# Patient Record
Sex: Female | Born: 1935 | Race: White | Hispanic: No | Smoking: Never smoker
Health system: Southern US, Community
[De-identification: ages and names within clinical notes are randomized; demographics above are authoritative.]

## PROBLEM LIST (undated history)

## (undated) DIAGNOSIS — K635 Polyp of colon: Secondary | ICD-10-CM

## (undated) DIAGNOSIS — C801 Malignant (primary) neoplasm, unspecified: Secondary | ICD-10-CM

## (undated) DIAGNOSIS — E785 Hyperlipidemia, unspecified: Secondary | ICD-10-CM

## (undated) DIAGNOSIS — R112 Nausea with vomiting, unspecified: Secondary | ICD-10-CM

## (undated) DIAGNOSIS — F329 Major depressive disorder, single episode, unspecified: Secondary | ICD-10-CM

## (undated) DIAGNOSIS — R21 Rash and other nonspecific skin eruption: Secondary | ICD-10-CM

## (undated) DIAGNOSIS — I10 Essential (primary) hypertension: Secondary | ICD-10-CM

## (undated) DIAGNOSIS — M1990 Unspecified osteoarthritis, unspecified site: Secondary | ICD-10-CM

## (undated) DIAGNOSIS — F32A Depression, unspecified: Secondary | ICD-10-CM

## (undated) DIAGNOSIS — Z8739 Personal history of other diseases of the musculoskeletal system and connective tissue: Secondary | ICD-10-CM

## (undated) DIAGNOSIS — K219 Gastro-esophageal reflux disease without esophagitis: Secondary | ICD-10-CM

## (undated) DIAGNOSIS — Q339 Congenital malformation of lung, unspecified: Secondary | ICD-10-CM

## (undated) DIAGNOSIS — E119 Type 2 diabetes mellitus without complications: Secondary | ICD-10-CM

## (undated) DIAGNOSIS — IMO0001 Reserved for inherently not codable concepts without codable children: Secondary | ICD-10-CM

## (undated) DIAGNOSIS — N281 Cyst of kidney, acquired: Secondary | ICD-10-CM

## (undated) DIAGNOSIS — Z85828 Personal history of other malignant neoplasm of skin: Secondary | ICD-10-CM

## (undated) DIAGNOSIS — D649 Anemia, unspecified: Secondary | ICD-10-CM

## (undated) DIAGNOSIS — R351 Nocturia: Secondary | ICD-10-CM

## (undated) DIAGNOSIS — E039 Hypothyroidism, unspecified: Secondary | ICD-10-CM

## (undated) DIAGNOSIS — Z9889 Other specified postprocedural states: Secondary | ICD-10-CM

---

## 1998-11-03 ENCOUNTER — Other Ambulatory Visit: Admission: RE | Admit: 1998-11-03 | Discharge: 1998-11-03 | Payer: Self-pay | Admitting: Gynecology

## 1999-08-04 ENCOUNTER — Encounter: Admission: RE | Admit: 1999-08-04 | Discharge: 1999-08-04 | Payer: Self-pay | Admitting: Family Medicine

## 1999-08-04 ENCOUNTER — Encounter: Payer: Self-pay | Admitting: Family Medicine

## 1999-08-12 ENCOUNTER — Encounter: Payer: Self-pay | Admitting: Family Medicine

## 1999-08-12 ENCOUNTER — Encounter: Admission: RE | Admit: 1999-08-12 | Discharge: 1999-08-12 | Payer: Self-pay | Admitting: Family Medicine

## 1999-11-11 ENCOUNTER — Other Ambulatory Visit: Admission: RE | Admit: 1999-11-11 | Discharge: 1999-11-11 | Payer: Self-pay | Admitting: Gynecology

## 1999-11-19 ENCOUNTER — Encounter: Admission: RE | Admit: 1999-11-19 | Discharge: 1999-11-19 | Payer: Self-pay | Admitting: Gynecology

## 1999-11-19 ENCOUNTER — Encounter: Payer: Self-pay | Admitting: Gynecology

## 2000-08-30 ENCOUNTER — Encounter: Admission: RE | Admit: 2000-08-30 | Discharge: 2000-08-30 | Payer: Self-pay | Admitting: Family Medicine

## 2000-08-30 ENCOUNTER — Encounter: Payer: Self-pay | Admitting: Family Medicine

## 2000-11-15 ENCOUNTER — Other Ambulatory Visit: Admission: RE | Admit: 2000-11-15 | Discharge: 2000-11-15 | Payer: Self-pay | Admitting: Gynecology

## 2001-09-12 ENCOUNTER — Encounter: Admission: RE | Admit: 2001-09-12 | Discharge: 2001-09-12 | Payer: Self-pay | Admitting: Family Medicine

## 2001-09-12 ENCOUNTER — Encounter: Payer: Self-pay | Admitting: Family Medicine

## 2001-11-28 ENCOUNTER — Other Ambulatory Visit: Admission: RE | Admit: 2001-11-28 | Discharge: 2001-11-28 | Payer: Self-pay | Admitting: Gynecology

## 2002-09-25 ENCOUNTER — Encounter: Payer: Self-pay | Admitting: Family Medicine

## 2002-09-25 ENCOUNTER — Encounter: Admission: RE | Admit: 2002-09-25 | Discharge: 2002-09-25 | Payer: Self-pay | Admitting: Family Medicine

## 2002-09-27 ENCOUNTER — Encounter: Payer: Self-pay | Admitting: Family Medicine

## 2002-09-27 ENCOUNTER — Encounter: Admission: RE | Admit: 2002-09-27 | Discharge: 2002-09-27 | Payer: Self-pay | Admitting: Family Medicine

## 2002-10-11 HISTORY — PX: COLON SURGERY: SHX602

## 2003-01-15 ENCOUNTER — Ambulatory Visit (HOSPITAL_COMMUNITY): Admission: RE | Admit: 2003-01-15 | Discharge: 2003-01-15 | Payer: Self-pay | Admitting: Gastroenterology

## 2003-01-15 ENCOUNTER — Encounter (INDEPENDENT_AMBULATORY_CARE_PROVIDER_SITE_OTHER): Payer: Self-pay | Admitting: Specialist

## 2003-02-07 ENCOUNTER — Ambulatory Visit (HOSPITAL_COMMUNITY): Admission: RE | Admit: 2003-02-07 | Discharge: 2003-02-07 | Payer: Self-pay | Admitting: General Surgery

## 2003-02-07 ENCOUNTER — Encounter: Payer: Self-pay | Admitting: General Surgery

## 2003-02-21 ENCOUNTER — Inpatient Hospital Stay (HOSPITAL_COMMUNITY): Admission: RE | Admit: 2003-02-21 | Discharge: 2003-02-25 | Payer: Self-pay | Admitting: General Surgery

## 2003-02-21 ENCOUNTER — Encounter (INDEPENDENT_AMBULATORY_CARE_PROVIDER_SITE_OTHER): Payer: Self-pay | Admitting: Specialist

## 2003-03-06 ENCOUNTER — Ambulatory Visit: Admission: RE | Admit: 2003-03-06 | Discharge: 2003-03-06 | Payer: Self-pay | Admitting: General Surgery

## 2003-03-07 ENCOUNTER — Encounter: Admission: RE | Admit: 2003-03-07 | Discharge: 2003-03-07 | Payer: Self-pay | Admitting: Family Medicine

## 2003-03-07 ENCOUNTER — Encounter: Payer: Self-pay | Admitting: Family Medicine

## 2003-03-23 ENCOUNTER — Ambulatory Visit (HOSPITAL_COMMUNITY): Admission: RE | Admit: 2003-03-23 | Discharge: 2003-03-23 | Payer: Self-pay | Admitting: General Surgery

## 2003-03-23 ENCOUNTER — Encounter: Payer: Self-pay | Admitting: General Surgery

## 2003-03-31 ENCOUNTER — Inpatient Hospital Stay (HOSPITAL_COMMUNITY): Admission: EM | Admit: 2003-03-31 | Discharge: 2003-04-03 | Payer: Self-pay | Admitting: Emergency Medicine

## 2003-03-31 ENCOUNTER — Encounter: Payer: Self-pay | Admitting: Surgery

## 2003-04-01 ENCOUNTER — Encounter: Payer: Self-pay | Admitting: Surgery

## 2003-06-07 ENCOUNTER — Encounter: Payer: Self-pay | Admitting: Family Medicine

## 2003-06-07 ENCOUNTER — Encounter: Admission: RE | Admit: 2003-06-07 | Discharge: 2003-06-07 | Payer: Self-pay | Admitting: Family Medicine

## 2003-09-11 ENCOUNTER — Ambulatory Visit (HOSPITAL_COMMUNITY): Admission: RE | Admit: 2003-09-11 | Discharge: 2003-09-11 | Payer: Self-pay | Admitting: General Surgery

## 2003-09-17 ENCOUNTER — Ambulatory Visit (HOSPITAL_COMMUNITY): Admission: RE | Admit: 2003-09-17 | Discharge: 2003-09-17 | Payer: Self-pay | Admitting: Family Medicine

## 2003-10-12 HISTORY — PX: HERNIA REPAIR: SHX51

## 2003-11-08 ENCOUNTER — Observation Stay (HOSPITAL_COMMUNITY): Admission: RE | Admit: 2003-11-08 | Discharge: 2003-11-09 | Payer: Self-pay | Admitting: General Surgery

## 2003-12-24 ENCOUNTER — Encounter: Admission: RE | Admit: 2003-12-24 | Discharge: 2003-12-24 | Payer: Self-pay | Admitting: Thoracic Surgery

## 2004-02-05 ENCOUNTER — Ambulatory Visit (HOSPITAL_COMMUNITY): Admission: RE | Admit: 2004-02-05 | Discharge: 2004-02-05 | Payer: Self-pay | Admitting: Gastroenterology

## 2004-02-05 ENCOUNTER — Encounter (INDEPENDENT_AMBULATORY_CARE_PROVIDER_SITE_OTHER): Payer: Self-pay | Admitting: Specialist

## 2004-04-29 ENCOUNTER — Encounter: Admission: RE | Admit: 2004-04-29 | Discharge: 2004-04-29 | Payer: Self-pay | Admitting: Thoracic Surgery

## 2004-06-19 ENCOUNTER — Encounter: Admission: RE | Admit: 2004-06-19 | Discharge: 2004-06-19 | Payer: Self-pay | Admitting: Family Medicine

## 2004-09-02 ENCOUNTER — Encounter: Admission: RE | Admit: 2004-09-02 | Discharge: 2004-09-02 | Payer: Self-pay | Admitting: Thoracic Surgery

## 2004-10-11 HISTORY — PX: THYROIDECTOMY: SHX17

## 2005-03-02 ENCOUNTER — Encounter: Admission: RE | Admit: 2005-03-02 | Discharge: 2005-03-02 | Payer: Self-pay | Admitting: Thoracic Surgery

## 2005-03-31 ENCOUNTER — Encounter: Admission: RE | Admit: 2005-03-31 | Discharge: 2005-03-31 | Payer: Self-pay | Admitting: Endocrinology

## 2005-04-28 ENCOUNTER — Encounter (INDEPENDENT_AMBULATORY_CARE_PROVIDER_SITE_OTHER): Payer: Self-pay | Admitting: Specialist

## 2005-04-28 ENCOUNTER — Encounter: Admission: RE | Admit: 2005-04-28 | Discharge: 2005-04-28 | Payer: Self-pay | Admitting: Endocrinology

## 2005-04-28 ENCOUNTER — Other Ambulatory Visit: Admission: RE | Admit: 2005-04-28 | Discharge: 2005-04-28 | Payer: Self-pay | Admitting: Interventional Radiology

## 2005-04-29 ENCOUNTER — Other Ambulatory Visit: Admission: RE | Admit: 2005-04-29 | Discharge: 2005-04-29 | Payer: Self-pay | Admitting: Gynecology

## 2005-07-14 ENCOUNTER — Ambulatory Visit (HOSPITAL_COMMUNITY): Admission: RE | Admit: 2005-07-14 | Discharge: 2005-07-15 | Payer: Self-pay | Admitting: General Surgery

## 2005-07-14 ENCOUNTER — Encounter (INDEPENDENT_AMBULATORY_CARE_PROVIDER_SITE_OTHER): Payer: Self-pay | Admitting: *Deleted

## 2005-08-27 ENCOUNTER — Encounter: Admission: RE | Admit: 2005-08-27 | Discharge: 2005-08-27 | Payer: Self-pay | Admitting: Family Medicine

## 2005-09-01 ENCOUNTER — Encounter: Admission: RE | Admit: 2005-09-01 | Discharge: 2005-09-01 | Payer: Self-pay | Admitting: Thoracic Surgery

## 2006-03-31 ENCOUNTER — Encounter: Admission: RE | Admit: 2006-03-31 | Discharge: 2006-03-31 | Payer: Self-pay | Admitting: Family Medicine

## 2006-09-06 ENCOUNTER — Encounter: Admission: RE | Admit: 2006-09-06 | Discharge: 2006-09-06 | Payer: Self-pay | Admitting: Family Medicine

## 2007-08-02 ENCOUNTER — Other Ambulatory Visit: Admission: RE | Admit: 2007-08-02 | Discharge: 2007-08-02 | Payer: Self-pay | Admitting: Gynecology

## 2007-10-17 ENCOUNTER — Encounter: Admission: RE | Admit: 2007-10-17 | Discharge: 2007-10-17 | Payer: Self-pay | Admitting: Family Medicine

## 2008-10-16 ENCOUNTER — Encounter: Admission: RE | Admit: 2008-10-16 | Discharge: 2008-10-16 | Payer: Self-pay | Admitting: Family Medicine

## 2008-10-30 ENCOUNTER — Encounter: Admission: RE | Admit: 2008-10-30 | Discharge: 2008-10-30 | Payer: Self-pay | Admitting: Family Medicine

## 2009-11-03 ENCOUNTER — Encounter: Admission: RE | Admit: 2009-11-03 | Discharge: 2009-11-03 | Payer: Self-pay | Admitting: Family Medicine

## 2010-04-02 ENCOUNTER — Encounter: Admission: RE | Admit: 2010-04-02 | Discharge: 2010-04-02 | Payer: Self-pay | Admitting: Family Medicine

## 2010-11-04 ENCOUNTER — Encounter
Admission: RE | Admit: 2010-11-04 | Discharge: 2010-11-04 | Payer: Self-pay | Source: Home / Self Care | Attending: Family Medicine | Admitting: Family Medicine

## 2010-11-09 ENCOUNTER — Other Ambulatory Visit: Payer: Self-pay | Admitting: Family Medicine

## 2010-11-09 DIAGNOSIS — R928 Other abnormal and inconclusive findings on diagnostic imaging of breast: Secondary | ICD-10-CM

## 2010-11-10 ENCOUNTER — Encounter
Admission: RE | Admit: 2010-11-10 | Discharge: 2010-11-10 | Payer: Self-pay | Source: Home / Self Care | Attending: Family Medicine | Admitting: Family Medicine

## 2011-02-26 NOTE — Discharge Summary (Signed)
NAMEFRANCHESKA, Lauren Rose NO.:  [id number]   MEDICAL RECORD NO.:  [id number]                   PATIENT TYPE:  INP   LOCATION:  0454                                 FACILITY:  [facility]   PHYSICIAN:  Angelia Mould. Derrell Lolling, M.D.             DATE OF BIRTH:  1922/08/09   DATE OF ADMISSION:  03/31/2003  DATE OF DISCHARGE:  04/03/2003                                 DISCHARGE SUMMARY   FINAL DIAGNOSES:  1. Abdominal wall abscess.  2. Hypertension.  3. Villous adenoma of the cecum, status post right colectomy.   OPERATIONS PERFORMED:  Wound exploration, drainage of abscess, April 01, 2003.   HISTORY:  This is a 77 year old white female who has a significant recent  history of a right colectomy by me in May 2004 for a benign polyp of the  cecum.  She did well postoperatively but over the past few weeks developed  abdominal pain and swelling in the incision.  She underwent CT scan of the  abdomen apparently one week prior to admission which showed some fluid in  the abdominal wall.  A needle aspiration was performed, and the patient was  placed on oral antibiotics.  The patient developed progressive nausea and  pain.  She came to the emergency room and saw Dr. Gerrit Friends on the evening of  March 31, 2003.  She was admitted for further evaluation and management.   EXAM ON ADMISSION:  VITAL SIGNS:  She was noted to have a temperature of  97.7, pulse 105, respirations 22, blood pressure 157/83.  GENERAL:  She was in mild distress.  LUNGS:  Clear.  HEART:  Regular rate and rhythm.  ABDOMEN:  Soft.  There was a healed transverse wound in the abdomen just to  the right of the umbilicus with some cellulitis and a palpable mass  affecting this area.  Not truly fluctuant.   ADMISSION DATA:  White blood cell count 18,900, hemoglobin 10.6.   HOSPITAL COURSE:  The patient was admitted by Dr. Gerrit Friends and placed on  intravenous antibiotics.  A CT scan showed an abdominal wall  abscess but no  evidence for intraperitoneal disease.  Dr. Gerrit Friends notified me of her  admission, and I saw her in the early morning of April 01, 2003, and advised  her that she would need to be taken to the operating room for exploration  and drainage of this abscess.  She agreed with this plan.  The patient was  taken to the operating room at noon on April 01, 2003, and I drained what was  an obvious abdominal wall abscess.  Fascial layers appeared to be intact.  There was no evidence of enteric content, and the wound was irrigated and  packed.   Final culture grew Fusobacterium species and Fusarium species.   The patient did very well following drainage.  The wound very rapidly  cleaned  up and began to develop healthy granulation tissue, and there was no  significant odor or further drainage.  She was discharged on April 03, 2003,  feeling well, tolerating her diet, ambulatory, and was noting much  improvement in her general feeling of well being.  She was given a  prescription for Cipro for three more days.  Home health nursing was  arranged for b.i.d. dressing changes.  She is to return to see me in the  office in 10-12 days.                                               Angelia Mould. Derrell Lolling, M.D.    HMI/MEDQ  D:  04/18/2003  T:  04/18/2003  Job:  [id number]

## 2011-02-26 NOTE — Op Note (Signed)
Lauren Rose, Lauren Rose                            ACCOUNT NO.:  [id number]   MEDICAL RECORD NO.:  [id number]                   PATIENT TYPE:  INP   LOCATION:  0358                                 FACILITY:  [facility]   PHYSICIAN:  Angelia Mould. Derrell Lolling, M.D.             DATE OF BIRTH:  [birth year]   DATE OF PROCEDURE:  02/21/2003  DATE OF DISCHARGE:                                 OPERATIVE REPORT   PREOPERATIVE DIAGNOSIS:  Villous adenoma of the cecum.   POSTOPERATIVE DIAGNOSIS:  Villous adenoma of the cecum.   OPERATION PERFORMED:  Laparoscopic-assisted right colectomy.   SURGEON:  Angelia Mould. Derrell Lolling, M.D.   FIRST ASSISTANT:  Adolph Pollack, M.D.   OPERATIVE INDICATIONS:  This is a 77 year old white female in reasonably  good health.  She has had some vague right lower quadrant abdominal pain.  She had a screening colonoscopy recently which showed a 6 cm polypoid mass  in the cecum and another small polyp nearby.  There were small polyps in the  left colon which where biopsied and were totally benign.  The biopsy of the  cecal mass showed villous adenoma with high-grade dysplasia focally.  She  has been counseled as an outpatient regarding surgical management of this  problem.  She has undergone a bowel prep at home.  She is admitted  electively for laparoscopic right colectomy.   OPERATIVE FINDINGS:  There was a soft, palpable mass in the cecum near the  ileocecal valve.  The serosa of the colon looked perfectly normal.  There  were no palpably or visually enlarged lymph nodes in the colon mesentery or  small bowel mesentery.  The gallbladder, stomach, and duodenum looked  normal.  The superior and inferior surfaces of the right and left lobes of  the liver looked normal.  The spleen was not enlarged.  The right ureter was  easily visualized and preserved.  There was no retroperitoneal mass.   OPERATIVE TECHNIQUE:  Following the induction of general endotracheal  anesthesia, the  patient's abdomen was prepped and draped in a sterile  fashion.  A Foley catheter had been previously placed.  Transverse incision  was made at the superior rim of the umbilicus.  The fascia was incised  transversely.  The abdomen was entered under direct vision.  A 10 mm Hasson  trocar was inserted and secured with a pursestring suture of 0 Vicryl.  Pneumoperitoneum was created.  Video camera was inserted with visualization  and findings as described above.  A 10 mm trocar was placed in the  suprapubic area and a 10 mm trocar placed in the left mid abdomen at the  level of the umbilicus, lateral to the rectus sheath.   The abdomen was thoroughly explored with the patient in a variety of  positions to visualize all of the spaces of the abdomen, and I found no  other abnormality.  The patient was placed in steep Trendelenburg position and rotated to the  left.  We identified the cecum and the appendix and elevated these up.  Using the harmonic scalpel, we divided the lateral peritoneal attachments  and mobilized the right colon all the way from the cecum up to the hepatic  flexure and with blunt dissection, we were able to mobilize this all the way  to the midline.  We identified the C-loop of the duodenum.  We identified  the right ureter.  We further dissected the peritoneum in the right lower  quadrant which mobilized the terminal ileum and took the tension off of the  terminal ileum.  Once all of this was done, the cecum was completely mobile  and could be displaced all the way to the spleen.   The patient was then placed in reverse Trendelenburg position, and we took  down the hepatic flexure by dividing all of the lateral peritoneal  attachments and the tissue layers.  We were very careful to keep the  duodenum and the gallbladder in sight throughout this.  We mobilized the  transverse colon all the way over to the level of the middle colic vessels.  We inspected for bleeding and  found none.  We checked the mobility of the  colon and decided that we would extract this through a transverse incision  just above the umbilicus.   We made a 6 cm transverse incision just above the level of the umbilicus to  incorporate the supraumbilical trocar site.  Anterior rectus sheath, rectus  muscle, and posterior rectus sheaths were incised with electrocautery under  direct vision.  The abdominal cavity was entered.  We inserted a wound  protector.  We delivered the terminal ileum and right colon and hepatic  flexure through this wound, and we had excellent mobility.  We could palpate  the 4-5 cm soft mass at the ileocecal valve.  We found no adenopathy.   We cleaned off the transverse colon and identified the middle colic vessels.  We transected the transverse colon just to the right of the middle colic  vessels.  We transected the terminal ileum about five inches proximal to  ileocecal valve.  We scored the mesentery all the way back as far as  possible.  We isolated the ileocolic vessels and right colic vessels between  clamps and divided these.  They were suture ligated with 2-0 silk suture  ligature and then doubly tied with 2-0 silk ties.  The specimen was removed  and sent to the lab.  Dr. Jimmy Picket examined the specimen and noted that  we had excellent margins and felt that this was consistent with a villous  adenoma but because of its size, could not rule out carcinoma.  Final  pathology is pending.   Anastomosis was created between the terminal ileum and the mid transverse  colon using a GIA stapling device.  The defect left in the bowel was closed  with a TA 60 stapling device.  A few extra sutures of 3-0 silk were placed  to reinforce the staple lines in critical areas.   At this point, we changed our instruments and gloves and suction devices. We irrigated the abdomen through the open wound somewhat.  There did not  appear to be any bleeding.  We checked the  anastomosis, and it looked fine.  We closed the mesentery with interrupted figure-of-eight sutures of 3-0  silk.  We then returned the anastomosis and the intestinal  tract back to the  abdominal cavity.  The posterior rectus sheath was closed with a running  suture of #1 PDS.  The wound was irrigated with saline, and the anterior  rectus sheath was closed with a running suture of #1 PDS.  The wound was  irrigated with saline, and the skin was closed with skin staples.   We then reinsufflated the abdomen and reinserted the video camera and the  suction device.  We suctioned out what little fluid was left.  We looked  above the anastomosis and in the right gutter and down in the pelvis and  sucked out a little bit of irrigation fluid, but there did not appear to be  any bleeding whatsoever.  The anastomosis looked pink and healthy.   The trocars were removed under direct vision.  There was no bleeding from  the trocar sites.  The pneumoperitoneum was released.  The trocar sites were  closed with skin staples.  Clean bandages were placed and the patient taken  to the recovery room in stable condition.  Estimated blood loss was about 30  mL.  Complications none.  Sponge, needle, and instrument counts were  correct.                                               Angelia Mould. Derrell Lolling, M.D.    HMI/MEDQ  D:  02/21/2003  T:  02/21/2003  Job:  [id number]   cc:   Mosetta Putt, M.D.  [address] [place]  [place] [address]  Fax: [phone]   Llana Aliment. Malon Kindle., M.D.  [address], [address]  [place]  [place] [address]  Fax: [phone]

## 2011-02-26 NOTE — Op Note (Signed)
Lauren Rose, Lauren Rose NO.:  [id number]   MEDICAL RECORD NO.:  [id number]                   PATIENT TYPE:  INP   LOCATION:  0454                                 FACILITY:  [facility]   PHYSICIAN:  Angelia Mould. Derrell Lolling, M.D.             DATE OF BIRTH:  1922/04/09   DATE OF PROCEDURE:  04/01/2003  DATE OF DISCHARGE:                                 OPERATIVE REPORT   PREOPERATIVE DIAGNOSIS:  Abdominal wall abscess.   POSTOPERATIVE DIAGNOSIS:  Abdominal wall abscess.   OPERATION:  Incision and drainage of abdominal wall abscess, debridement of  subcutaneous tissue.   SURGEON:  Angelia Mould. Derrell Lolling, M.D.   INDICATIONS FOR PROCEDURE:  This is a 77 year old white female who underwent  a laparoscopic assisted right colectomy approximately 5-6 weeks ago for a  villous adenoma of the cecum. She was recovering uneventfully from that  surgery. She presents now with a one week history of progressing swelling,  pain and erythema in the right side of the abdomen underlying the palpable  incision. CT scan performed late last night shows a large abdominal wall  abscess but there was no evidence of any intraperitoneal process.  Specifically there was no evidence of bowel obstruction, free fluid, air or  inflammatory process in the abdomen. It is felt that she has an abdominal  wall abscess and is brought to the operating room for drainage.   DESCRIPTION OF PROCEDURE:  Following the induction of general endotracheal  anesthesia, the patient's abdomen was prepped and draped in a sterile  fashion. The right transverse incision was incised and opened and I drained  a large foul smelling tan creamy purulent fluid from the abdominal wall.  This was sent for aerobic and anaerobic cultures as well as a stat Gram's  stain. This was irrigated out and a little bit of soft tissue was debrided.  The abdominal wall itself appeared intact. This did not appear to extend  into the  peritoneal cavity. There was no feces or enteric drainage  whatsoever. The wound was thoroughly washed out and irrigated several times  and then packed with a saline moistened Kerlix. Hemostasis was excellent and  achieved with electrocautery. The wound was covered with ABD pads and taped  in place.   She tolerated the procedure well and was taken to the recovery room in  stable condition. Estimated blood loss was about 20 mL. Complications none.  Sponge, needle and instrument counts were correct.                                               Angelia Mould. Derrell Lolling, M.D.    HMI/MEDQ  D:  04/01/2003  T:  04/02/2003  Job:  [id number]   cc:   Mosetta Putt, M.D.  [address] [place]  [place] [address]  Fax: [phone]

## 2011-02-26 NOTE — H&P (Signed)
NAME:  Lauren Rose, Lauren Rose NO.:  [id number]   MEDICAL RECORD NO.:  [id number]                   PATIENT TYPE:  EMS   LOCATION:  ED                                   FACILITY:  [facility]   PHYSICIAN:  Velora Heckler, M.D.                DATE OF BIRTH:  October 21, 1921   DATE OF ADMISSION:  03/31/2003  DATE OF DISCHARGE:                                HISTORY & PHYSICAL   CHIEF COMPLAINT:  Abdominal pain, nausea, right lower quadrant redness.   HISTORY OF PRESENT ILLNESS:  The patient is a 77 year old white female who  presents this morning to the emergency department at [facility]  with abdominal pain, erythema at the right lower quadrant wound, and nausea.  The patient underwent partial colectomy by Dr. Claud Kelp in May 2004,  for polyp of the cecum.  The patient did well postoperatively.  However,  over the past few weeks she has noted development of abdominal pain.  She  underwent CT scan of the abdomen eight days ago at [facility].  Dr. Oley Balm performed fine needle aspiration of fluid.  Cultures were slow to  return, but the patient was started on oral antibiotics on Friday, March 29, 2003.  She began taking oral Cipro and Flagyl.  Over the past several days,  the patient has noted nausea, increased abdominal pain.  The patient does  note that she is having normal bowel movements without pain and no evidence  of bleeding.  She has had no significant fever.  However, she has noted  swelling of the right lower quadrant and redness on the abdominal wall.  She  presents to the emergency department for evaluation.   PAST MEDICAL HISTORY:  1. Status post partial colectomy in May 2004, by Dr. Claud Kelp.  2. History of hypertension.  3. Status post thyroidectomy in 1970's by Dr. Langston Masker.   MEDICATIONS:  1. Cozaar 50 mg daily.  2. Norvasc 5 mg daily.  3. Currently on Cipro and Flagyl p.o.   ALLERGIES:  PENICILLIN causing diarrhea.   SOCIAL HISTORY:  The patient is accompanied by her daughter.  She lives at  home here in [place] with her husband.  She does not smoke.  She does not  drink alcohol.   FAMILY HISTORY:  Noncontributory.   REVIEW OF SYSTEMS:  A 15 system review without significant other positives  except as noted above.   PHYSICAL EXAMINATION:  GENERAL:  A 77 year old well-developed, well-  nourished white female on a stretcher in the emergency department.  VITAL SIGNS:  Temperature 97.7, pulse 105, respirations 22, blood pressure  157/83.  HEENT:  Normocephalic.  Sclerae are clear.  Dentition is fair.  Voice  quality is normal.  NECK:  A well-healed surgical wound.  There is fullness in the right thyroid  bed without discrete nodule.  There is  no tenderness.  There is no anterior  or posterior cervical adenopathy.  LUNGS:  Clear to auscultation bilaterally.  There are no rales or rhonchi.  There is no costovertebral angle tenderness.  CARDIAC:  Regular rate and rhythm without murmur.  Peripheral pulses are  full.  ABDOMEN:  Soft, mildly distended.  There is a healed surgical wound in the  left lower quadrant measuring approximately 1.5 cm.  There is a surgical  wound just to the right of the umbilicus in the right lower quadrant  measuring approximately 6 cm in length.  It is healed.  There is no  drainage.  However, there is considerable induration and erythema on the  abdominal wall.  On palpation, there is a mass effect which measures  approximately 15 cm in dimension.  It is not truly fluctuant, but there is  concern over subcutaneous abscess versus a deeper intra-abdominal process.  There is tenderness and voluntary guarding.  There are bowel sounds present.  There is no sign of hernia.  EXTREMITIES:  Nontender without edema.  NEUROLOGIC:  The patient is alert and oriented without focal deficit.   LABORATORY DATA:  White blood cell count 18.9, hemoglobin 10.6, platelet  count 698,000.   Complete metabolic profile and urinalysis are pending.   RADIOGRAPHIC STUDIES:  Abdominal x-rays today show air fluid levels in the  right lower quadrant with questionable bowel wall edema.  Chest x-ray shows  no active disease.   DIAGNOSES:  1. Abdominal wall cellulitis, rule out subcutaneous versus intra-abdominal     abscess, rule out colonic fistula.  2. Hypertension.  3. Dehydration.   PLAN:  1. Admission to [facility].  2. Conversion to intravenous antibiotics.  3. CT scan of the abdomen and pelvis with potential percutaneous drainage.  4. Potential operative intervention with drainage of subcutaneous or intra-     abdominal abscess.                                               Velora Heckler, M.D.    TMG/MEDQ  D:  03/31/2003  T:  03/31/2003  Job:  [id number]   cc:   Angelia Mould. Derrell Lolling, M.D.  [address]  [place]  [place] [address]  Fax: [phone]   Mosetta Putt, M.D.  [address] [place]  [place] [address]  Fax: [phone]   Llana Aliment. Malon Kindle., M.D.  [address]  [place]  [place] [address]  Fax: [phone]

## 2011-02-26 NOTE — Op Note (Signed)
NAMERAEONNA, MILO NO.:  [id number]   MEDICAL RECORD NO.:  [id number]          PATIENT TYPE:  OIB   LOCATION:  0098                         FACILITY:  [facility]   PHYSICIAN:  Angelia Mould. Derrell Lolling, M.D.DATE OF BIRTH:  [birth year]   DATE OF PROCEDURE:  07/14/2005  DATE OF DISCHARGE:                                 OPERATIVE REPORT   PREOPERATIVE DIAGNOSIS:  Bilateral thyroid nodules.   POSTOPERATIVE DIAGNOSIS:  Bilateral thyroid nodules.   OPERATION PERFORMED:  Total thyroidectomy.   SURGEON:  Angelia Mould. Derrell Lolling, M.D.   FIRST ASSISTANT:  Maisie Fus A. Cornett, M.D.   OPERATIVE INDICATIONS:  This is an 77 year old white female who underwent  some type of thyroid surgery many years ago by Dr. Langston Masker. I think she  simply had an adenoma excised from the central portion of the neck. She has  been known to have an autonomously functioning right thyroid nodule which is  hot on a scan but clinically and biochemically she has been euthyroid. She  states the nodule on the right side of her neck has been enlarging. On the  left side, she has a small nodule which has also increased in size to 2 cm.  Fine needle aspiration cytology of the right side shows a benign  nonneoplastic goiter. Fine needle aspiration cytology of the left side shows  a follicular lesion which is somewhat worrisome and indeterminate. She is  anxious and would like everything removed. I have evaluated her as an  outpatient and we have planned a total thyroidectomy.   FINDINGS:  The patient had a soft, smooth mass in the right thyroid lobe  about 2-1/2 to 3 cm in size. There may have been another small, hard nodule  medially as well. On the left side, there was small, firm nodule in the mid  portion of the thyroid. I did not feel any adenopathy. The isthmus of the  thyroid was surgically absent. On the left side, I identified the superior  and inferior parathyroid glands and they were preserved. On the right  side,  I identified the inferior parathyroid gland but I did not see a superior  parathyroid gland on the right.   OPERATIVE TECHNIQUE:  Following the induction of general endotracheal  anesthesia, the patient was placed in a reverse Trendelenburg position with  the neck extended. The neck was prepped and draped in a sterile fashion. A  curved incision was made transversely in the neck about 2 cm above the  suprasternal notch. Dissection was carried down through subcutaneous tissue  and through the platysma. Platysma flaps were raised superiorly and  inferiorly with electrocautery and a self-retaining retractor was placed.  The strap muscles were divided in the midline. We found that the trachea was  immediately encountered and there was no isthmus to the thyroid. We did  dissect the strap muscles off the right and left lobes of the thyroid and  mobilized these. I first dissected the left side, dissecting the muscles off  of the thyroid all the way. I isolated the superior thyroid vessels and  ligated  them in continuity with 2-0 silk ties and I placed a metal clip  above the upper, tied for security and then divided the upper pole vessels.  The lower pole was mobilized mostly with blunt dissection. There was a few  venous channels that were divided between metal clips. We then dissected the  rest of the left lobe from lateral to medial controlling the middle thyroid  vein and the inferior thyroid artery with metal clips and dividing them. We  identified the recurrent laryngeal nerve on the left and it was preserved.  We dissected the thyroid gland up off the trachea and sent it as a separate  specimen labeled as left thyroid lobe.   We then did mobilize the right thyroid lobe dissecting the strap muscles off  of the thyroid gland. We isolated the upper pole vessels, ligated them in  continuity with 2-0 silk ligatures and placed a metal clip above the upper  ligature and then divided the  upper pole vessels. The lower pole was  mobilized mostly by blunt dissection since the cystic smooth process was  mostly present in the lower pole. We dissected and stayed right in the  capsule of the thyroid gland. The middle thyroid vein was isolated,  controlled with metal clips and divided. The inferior thyroid artery was  isolated, controlled with metal clips and divided. Keeping the dissection in  the thyroid gland kept as well away from the region of the recurrent  laryngeal nerve but we did not visualize the recurrent laryngeal nerve on  the right side. Nevertheless, the thyroid gland came easily up onto the  trachea and was removed and sent as a separate specimen. The wounds were  irrigated with saline. Hemostasis was excellent. We placed some Surgicel  gauze in the bed of the thyroid on each side and observed this for 3 or 4  minutes and there was no bleeding. The strap muscles were closed in the  midline with interrupted sutures of 3-0 Vicryl. The platysma muscle was  closed with interrupted sutures of 3-0 Vicryl, the skin closed with skin  staples and Steri-Strips. Clean bandages were placed and the patient taken  to the recovery room in stable condition. Estimated blood loss was about 20  of 25 mL. Complications none. Sponge, needle and instrument counts were  correct.      Angelia Mould. Derrell Lolling, M.D.  Electronically Signed     HMI/MEDQ  D:  07/14/2005  T:  07/14/2005  Job:  [id number]   cc:   Dorisann Frames, M.D.  Fax: [phone]   Mosetta Putt, M.D.  Fax: [phone]   Llana Aliment. Malon Kindle., M.D.  Fax: [phone]

## 2011-02-26 NOTE — Op Note (Signed)
NAME:  Lauren Rose, Lauren Rose                            ACCOUNT NO.:  [id number]   MEDICAL RECORD NO.:  [id number]                   PATIENT TYPE:  AMB   LOCATION:  ENDO                                 FACILITY:  [facility]   PHYSICIAN:  James L. Malon Kindle., M.D.          DATE OF BIRTH:  [birth year]   DATE OF PROCEDURE:  02/05/2004  DATE OF DISCHARGE:                                 OPERATIVE REPORT   PROCEDURE:  Colonoscopy and polypectomy.   MEDICATIONS:  Fentanyl 75 mcg, Versed 6 mg IV.   INDICATIONS:  Patient has had multiple adenomatous colon polyps removed  slightly over a year ago.  Had a massive polyp with high-grade glandular  dysplasia in the cecum requiring a right hemicolectomy.  This, fortunately,  was not malignant but did have high-grade glandular dysplasia.  This is done  as a follow-up.   DESCRIPTION OF PROCEDURE:  The procedure had been explained to the patient  and consent obtained.  With the patient in the left lateral decubitus  position, the Olympus scope was inserted and advanced.  Prep was quite good.  We were able to reach the anastomosis using some abdominal pressure.  It was  clearly seen.  The scope was withdrawn and the transverse colon, splenic  flexure were seen well.  At 80 cm from the anal verge in the descending  colon, two polyps were seen.  One was 3 mm, I removed with the snare and  sucked though the scope.  The other was a 1 cm sessile polyp that was  removed in two pieces and sucked through the scope.  No other polyps were  seen in the descending, sigmoid colon, or rectum.  The scope was withdrawn.  The patient tolerated the procedure well.   ASSESSMENT:  Colon polyps removed from the descending colon.   PLAN:  Routine postpolypectomy instructions.  Will check pathology and  recommend repeating in three years.                                               James L. Malon Kindle., M.D.    Waldron Session  D:  02/05/2004  T:  02/05/2004  Job:  [id number]   cc:    Mosetta Putt, M.D.  [address] [place]  [place] [address]  Fax: [phone]   Angelia Mould. Derrell Lolling, M.D.  [address]  [place]  [place] [address]  Fax: [phone]

## 2011-02-26 NOTE — Op Note (Signed)
NAME:  Lauren Rose, Lauren Rose                            ACCOUNT NO.:  [id number]   MEDICAL RECORD NO.:  [id number]                   PATIENT TYPE:  AMB   LOCATION:  DAY                                  FACILITY:  [facility]   PHYSICIAN:  Angelia Mould. Derrell Lolling, M.D.             DATE OF BIRTH:  1922/10/04   DATE OF PROCEDURE:  11/08/2003  DATE OF DISCHARGE:                                 OPERATIVE REPORT   PREOPERATIVE DIAGNOSIS:  Ventral incisional hernia.   POSTOPERATIVE DIAGNOSIS:  Ventral incisional hernia.   OPERATION:  Laparoscopic repair of ventral incisional hernia with mesh (15  cm x 18 cm Parietex composite mesh).   SURGEON:  Angelia Mould. Derrell Lolling, M.D.   INDICATIONS FOR PROCEDURE:  This is a 77 year old white female who underwent  a laparoscopic assisted right colectomy for villous adenoma of the cecum  complicated by a postoperative wound abscess necessitating open drainage and  healing by secondary intention.  She recovered uneventfully but subsequently  developed a ventral hernia which is approximately 5-6 cm in diameter and has  been enlarging and has been bothering the patient. She is stable medically  and is brought to the operating room electively for repair of her hernia.   TECHNIQUE:  Following the induction of general endotracheal anesthesia, a  Foley catheter was inserted. The patient's abdomen was prepped and draped in  a sterile fashion.  A transverse incision was made in the left mid abdomen.  Using an Optiview trocar and the zero degree 10 mm scope, we passed the  Optiview port through the abdominal wall layers without much trouble and  entered easily a free space into the left upper quadrant.  Pneumoperitoneum  was created at 15 mmHg.  The video camera was inserted and we had good  visualization.  There was a large ventral hernia with a great deal of  omentum adherent to this. We ultimately placed 5 mm trocars in the  epigastrium, left lower quadrant and right mid abdomen to  allow [place] to view  the hernia and the repair from all angles.  Using the harmonic scalpel and  counter traction, we slowly debrided all of the omentum out of the hernia  sac.  That went well. There was never any small bowel or colon in the way.  Once this was all cleared out, we checked for bleeding and there was none.  We then marked the hernia site by passing a spinal needle full thickness  through the abdominal wall.  We then marked the abdominal wall so that we  could measure how big the mesh needed to be and we made the mesh 3 or 4 cm  wider at all places.  This ultimately turned out to be about 18 cm  transversely by about 15 cm vertically.  We brought a piece of Parietex  composite mesh to the operative field and we marked the template of the  repair on the abdominal wall marking eight equidistant points for suture  fixation.  The mesh was cut to match this and then marked appropriately.  We  used #0 Novofil sutures and passed them through the mesh at the eight  equidistant points being careful to place this on the anterior, rough edge  making sure that the smooth edge was posterior.  After all of these sutures  were placed, the mesh was moistened with saline, rolled up and then inserted  into the abdominal cavity. The mesh was then opened up and positioned.  We  brought the suture fixation through the abdominal wall at the eight  equidistant points through tiny puncture wounds and using the endoclose  device. After all of the sutures were passed through the abdominal wall,  they were pulled up and the mesh covered the defect quite nicely and there  was really no redundancy.  All the sutures were tied.  We then used the 5 mm  tacker and probably used 50-55 tacks to tack around the entire circumference  of the mesh making sure that all the tacks were no more than 1 cm apart. We  also placed a few of the 5 mm tacks centrally to fix the mesh to the  abdominal wall.  We inspected the mesh  fixation from multiple angles on  three occasions and found good fixation and we were very satisfied with  this. Trocars were removed under direct vision, there was no bleeding from  the trocar sites. The  pneumoperitoneum was released.  The skin incisions were closed with  subcuticular sutures of 4-0 Vicryl and Steri-Strips.  Clean bandages were  placed and the patient taken to the recovery room in stable condition.  Estimated blood loss was about 15-20 mL.  Complications none. Sponge, needle  and instrument counts were correct.                                               Angelia Mould. Derrell Lolling, M.D.    HMI/MEDQ  D:  11/08/2003  T:  11/08/2003  Job:  [id number]   cc:   Mosetta Putt, M.D.  [address] [place]  [place] [address]  Fax: [phone]

## 2011-02-26 NOTE — Op Note (Signed)
NAME:  Lauren Rose, Lauren Rose                            ACCOUNT NO.:  [id number]   MEDICAL RECORD NO.:  [id number]                   PATIENT TYPE:  AMB   LOCATION:  ENDO                                 FACILITY:  [facility]   PHYSICIAN:  James L. Malon Kindle., M.D.          DATE OF BIRTH:  [birth year]   DATE OF PROCEDURE:  01/15/2003  DATE OF DISCHARGE:                                 OPERATIVE REPORT   PROCEDURE:  Colonoscopy with biopsy and polypectomy.   MEDICATIONS:  Fentanyl 75 mcg, Versed 7 mg IV.   INDICATIONS FOR PROCEDURE:  The patient does have a family history of colon  polyps, never had a screening colonoscopy.   DESCRIPTION OF PROCEDURE:  The procedure had been explained to the patient  and consent obtained. With the patient in the left lateral decubitus  position, the Olympus pediatric scope was inserted and advanced. In the  sigmoid colon, we saw several polyps upon entry. Using abdominal pressure  and position change, we were able to advance over to the cecum, the right  lower quadrant transilluminated. The cecum was filled by a large polypoid  mass with surface ulcerations, it was soft and mobile. The size was  estimated to be at least 6 cm when we opened, biopsy forceps were placed in  the lumen, it may have been larger. After evaluating this, I felt this was  not really amenable to endoscopic removal and took multiple biopsies. Very  close to this, 2 or 3 cm away was a 1 cm sessile polyp that I elected to  leave at this point since it was clear she does need a surgical procedure.  The remainder of the ascending colon and transverse colon were normal. At 80  cm from the anal verge, a 3 cm sessile polyp was encountered and was removed  in three pieces. I placed them in jar #2. At 45 cm from the anal verge, a 1  cm sessile polyp was removed and placed in jar #3. At 30 cm from the anal  verge, a 1 to 1 1/2 cm was removed and placed in jar #4. No other polyps  were seen. The scope  was withdrawn.  The patient tolerated the procedure  well.   ASSESSMENT:  1. Large polypoid cecal mass high risk of cancer in this and it is not     really very amenable to endoscopic removal.  2. Descending colon and sigmoid colon polyps removed.   PLAN:  Will check path. Will likely need a right hemicolectomy. After the  path report is returned will discuss this with her in further detail. Will  definitely need frequent followup colonoscopies. Will plan on doing her in  one year for repeat.  James L. Malon Kindle., M.D.   Waldron Session  D:  01/15/2003  T:  01/15/2003  Job:  [id number]   cc:   Mosetta Putt, M.D.  [address] [place]  [place] [address]  Fax: [phone]

## 2011-02-26 NOTE — Discharge Summary (Signed)
Lauren Rose, Lauren Rose                            ACCOUNT NO.:  [id number]   MEDICAL RECORD NO.:  [id number]                   PATIENT TYPE:  INP   LOCATION:  0358                                 FACILITY:  [facility]   PHYSICIAN:  Angelia Mould. Derrell Lolling, M.D.             DATE OF BIRTH:  1922/03/21   DATE OF ADMISSION:  02/21/2003  DATE OF DISCHARGE:  02/25/2003                                 DISCHARGE SUMMARY   FINAL DIAGNOSES:  1. Villous adenoma of the ascending colon with focal high grade dysplasia.  2. Hypertension.  3. Gout.   OPERATIONS PERFORMED:  Laparoscopic assisted right colectomy.  Date of  surgery Feb 21, 2003.   HISTORY:  This is a 77 year old white female who has had some vague right  lower quadrant abdominal pain, but really no GI symptoms.  Recent screening  colonoscopy showed a polypoid mass in the cecum and also showed three small  polyps on the left side of the colon.  The biopsy of the cecal mass showed  villous adenoma with focal high grade dysplasia but the polyps on the left  side were fairly benign as they were just polyps with no dysplasia.  I  evaluated her as an outpatient for consideration of surgical resection of  the lesion of her right colon.  Appears she has undergone bowel prep at home  and is admitted electively.   For details of her past history, family history, and social history, please  see the detailed admission note.   PHYSICAL EXAMINATION:  GENERAL:  Pleasant older woman in no distress.  VITAL SIGNS:  Weight 150 pounds, blood pressure 187/92, pulse 110,  respiratory rate 18 and unlabored.  NECK:  No adenopathy.  No mass.  No tenderness.  LUNGS:  Clear to auscultation.  CARDIOVASCULAR:  Regular rate and rhythm.  No murmur.  No rubs.  ABDOMEN:  Soft, slightly obese, nontender, no mass.  No hernia.  Liver and  spleen not enlarged.  LYMPHATICS:  No enlarged lymph nodes in the neck or groins.   HOSPITAL COURSE:  On the day of admission the patient  was taken to the  operating room.  She underwent laparoscopic assisted right colectomy.  I  found a small palpable mass in the cecum near the ileocecal valve.  The  serosa of the bowel was perfectly normal.  There were no grossly enlarged  lymph nodes.  There were no signs of any metastatic disease or  retroperitoneal mass.  The surgery was uneventful.   Final pathology report showed a villous adenoma with focal high grade  dysplasia in the cecum measuring 4 cm in size.  A second tubovillous adenoma  measuring 1.5 cm in size was also noted but there was no dysplasia there.  Six lymph nodes were examined and were all benign.  Margins were good.   Postoperatively the patient did well.  She had no major  complications.  She  was started on clear liquid diet on the first postoperative day and we  restarted her antihypertensive medications on the second postoperative day  and advanced her liquid diet.  On Feb 25, 2003 she had been tolerating full  liquids.  Was advanced to a regular diet and tolerated that and was able to  go home later that afternoon.  She was passing flatus and had had a small  stool reportedly.   On the day of discharge she was afebrile, was asking to go home.  Her wound  was healing uneventfully.  She was asked to follow up with me in the office  in three to four days.                                               Angelia Mould. Derrell Lolling, M.D.    HMI/MEDQ  D:  03/12/2003  T:  03/12/2003  Job:  [id number]   cc:   Fayrene Fearing L. Malon Kindle., M.D.  [address]  [place]  [place] [address]  Fax: [phone]   Mosetta Putt, M.D.  [address] [place]  [place] [address]  Fax: [phone]

## 2011-03-22 ENCOUNTER — Ambulatory Visit
Admission: RE | Admit: 2011-03-22 | Discharge: 2011-03-22 | Disposition: A | Discharge status: Home / Self Care | Payer: Medicare Other | Source: Ambulatory Visit | Attending: Family Medicine | Admitting: Family Medicine

## 2011-03-22 ENCOUNTER — Other Ambulatory Visit: Payer: Self-pay | Admitting: Family Medicine

## 2011-11-02 ENCOUNTER — Ambulatory Visit (INDEPENDENT_AMBULATORY_CARE_PROVIDER_SITE_OTHER): Payer: Medicare Other | Admitting: *Deleted

## 2011-11-02 DIAGNOSIS — I10 Essential (primary) hypertension: Secondary | ICD-10-CM

## 2011-11-03 ENCOUNTER — Encounter: Payer: Self-pay | Admitting: Family Medicine

## 2011-11-11 ENCOUNTER — Other Ambulatory Visit: Payer: Self-pay | Admitting: Gastroenterology

## 2011-11-11 NOTE — Procedures (Unsigned)
RENAL ARTERY DUPLEX EVALUATION  INDICATION:  Accelerated hypertension/abdominal bruit.  HISTORY: Diabetes: Cardiac: Hypertension:  Yes. Smoking:  No.  RENAL ARTERY DUPLEX FINDINGS: Aorta-Proximal:  78 cm/s Aorta-Mid:  110 cm/s Aorta-Distal:  87 cm/s Celiac Artery Origin:  229 cm/s SMA Origin:  128 cm/s                                   RIGHT               LEFT Renal Artery Origin:             NV due to angle     171 cm/s Renal Artery Proximal:           148 cm/s            190 cm/s Renal Artery Mid:                110 cm/s            192 cm/s Renal Artery Distal:             90 cm/s             166 cm/s Hilar Acceleration Time (AT): Renal-Aortic Ratio (RAR):        1.9                 2.5 Kidney Size:                     11.4 cm             10.7 cm End Diastolic Ratio (EDR): Resistive Index (RI):            0.81/0.73           0.86/0.84  IMPRESSION: 1. No evidence of significant (greater than 60%) stenosis of bilateral     renal arteries. 2. There are large hypoechoic cystic structures bilaterally; 6.98 cm x     8.23 cm on the right, 3.0 cm x 3.5 cm on the left.  Unable to     determine renal involvement. 3. Bilateral kidney size may not be accurate due to cystic structures. 4. Bilateral renal vascular resistance is abnormally elevated. 5. Evidence of celiac axis stenosis.  ___________________________________________ Janetta Hora Fields, MD  LT/MEDQ  D:  11/02/2011  T:  11/02/2011  Job:  [id number]

## 2011-11-15 ENCOUNTER — Other Ambulatory Visit: Payer: Self-pay | Admitting: Family Medicine

## 2011-11-15 DIAGNOSIS — Z1231 Encounter for screening mammogram for malignant neoplasm of breast: Secondary | ICD-10-CM

## 2011-11-25 ENCOUNTER — Ambulatory Visit
Admission: RE | Admit: 2011-11-25 | Discharge: 2011-11-25 | Disposition: A | Discharge status: Home / Self Care | Payer: Medicare Other | Source: Ambulatory Visit | Attending: Family Medicine | Admitting: Family Medicine

## 2011-11-25 DIAGNOSIS — Z1231 Encounter for screening mammogram for malignant neoplasm of breast: Secondary | ICD-10-CM

## 2012-11-09 ENCOUNTER — Other Ambulatory Visit: Payer: Self-pay | Admitting: Family Medicine

## 2012-11-09 ENCOUNTER — Ambulatory Visit
Admission: RE | Admit: 2012-11-09 | Discharge: 2012-11-09 | Disposition: A | Discharge status: Home / Self Care | Payer: Medicare Other | Source: Ambulatory Visit | Attending: Family Medicine | Admitting: Family Medicine

## 2012-11-09 DIAGNOSIS — M25552 Pain in left hip: Secondary | ICD-10-CM

## 2012-11-13 ENCOUNTER — Other Ambulatory Visit: Payer: Self-pay | Admitting: Family Medicine

## 2012-11-13 DIAGNOSIS — M25552 Pain in left hip: Secondary | ICD-10-CM

## 2012-11-24 ENCOUNTER — Inpatient Hospital Stay: Admission: RE | Admit: 2012-11-24 | Payer: Medicare Other | Source: Ambulatory Visit

## 2012-11-28 ENCOUNTER — Ambulatory Visit
Admission: RE | Admit: 2012-11-28 | Discharge: 2012-11-28 | Disposition: A | Discharge status: Home / Self Care | Payer: Medicare Other | Source: Ambulatory Visit | Attending: Family Medicine | Admitting: Family Medicine

## 2012-11-28 DIAGNOSIS — M25552 Pain in left hip: Secondary | ICD-10-CM

## 2012-11-28 MED ORDER — METHYLPREDNISOLONE ACETATE 40 MG/ML INJ SUSP (RADIOLOG
120.0000 mg | Freq: Once | INTRAMUSCULAR | Status: AC
  Administered 2012-11-28: 120 mg via INTRA_ARTICULAR

## 2012-11-28 MED ORDER — IOHEXOL 180 MG/ML  SOLN
1.0000 mL | Freq: Once | INTRAMUSCULAR | Status: AC | PRN
  Administered 2012-11-28: 1 mL via INTRA_ARTICULAR

## 2012-12-12 ENCOUNTER — Other Ambulatory Visit: Payer: Self-pay

## 2012-12-12 DIAGNOSIS — Z1231 Encounter for screening mammogram for malignant neoplasm of breast: Secondary | ICD-10-CM

## 2013-01-08 ENCOUNTER — Ambulatory Visit
Admission: RE | Admit: 2013-01-08 | Discharge: 2013-01-08 | Disposition: A | Discharge status: Home / Self Care | Payer: Medicare Other | Source: Ambulatory Visit

## 2013-01-08 DIAGNOSIS — Z1231 Encounter for screening mammogram for malignant neoplasm of breast: Secondary | ICD-10-CM

## 2013-07-10 NOTE — Progress Notes (Signed)
Need orders in EPIC.  Surgery scheduled for 07/20/13.  Preop on 10/2 at 200pm.  Thank You.

## 2013-07-11 ENCOUNTER — Other Ambulatory Visit (HOSPITAL_COMMUNITY): Payer: Self-pay | Admitting: Orthopaedic Surgery

## 2013-07-11 ENCOUNTER — Encounter (HOSPITAL_COMMUNITY): Payer: Self-pay | Admitting: Pharmacy Technician

## 2013-07-11 ENCOUNTER — Other Ambulatory Visit (HOSPITAL_COMMUNITY): Payer: Self-pay | Admitting: *Deleted

## 2013-07-12 ENCOUNTER — Encounter (HOSPITAL_COMMUNITY)
Admission: RE | Admit: 2013-07-12 | Discharge: 2013-07-12 | Disposition: A | Discharge status: Home / Self Care | Payer: Medicare Other | Source: Ambulatory Visit | Attending: Orthopaedic Surgery | Admitting: Orthopaedic Surgery

## 2013-07-12 ENCOUNTER — Encounter (HOSPITAL_COMMUNITY): Payer: Self-pay

## 2013-07-12 ENCOUNTER — Ambulatory Visit (HOSPITAL_COMMUNITY)
Admission: RE | Admit: 2013-07-12 | Discharge: 2013-07-12 | Disposition: A | Discharge status: Home / Self Care | Payer: Medicare Other | Source: Ambulatory Visit | Attending: Orthopaedic Surgery | Admitting: Orthopaedic Surgery

## 2013-07-12 DIAGNOSIS — E039 Hypothyroidism, unspecified: Secondary | ICD-10-CM | POA: Insufficient documentation

## 2013-07-12 DIAGNOSIS — Z01811 Encounter for preprocedural respiratory examination: Secondary | ICD-10-CM | POA: Insufficient documentation

## 2013-07-12 DIAGNOSIS — M1610 Unilateral primary osteoarthritis, unspecified hip: Secondary | ICD-10-CM | POA: Insufficient documentation

## 2013-07-12 DIAGNOSIS — M169 Osteoarthritis of hip, unspecified: Secondary | ICD-10-CM | POA: Insufficient documentation

## 2013-07-12 DIAGNOSIS — E119 Type 2 diabetes mellitus without complications: Secondary | ICD-10-CM | POA: Insufficient documentation

## 2013-07-12 DIAGNOSIS — Z01812 Encounter for preprocedural laboratory examination: Secondary | ICD-10-CM | POA: Insufficient documentation

## 2013-07-12 DIAGNOSIS — C73 Malignant neoplasm of thyroid gland: Secondary | ICD-10-CM | POA: Insufficient documentation

## 2013-07-12 DIAGNOSIS — I10 Essential (primary) hypertension: Secondary | ICD-10-CM | POA: Insufficient documentation

## 2013-07-12 HISTORY — DX: Unspecified osteoarthritis, unspecified site: M19.90

## 2013-07-12 HISTORY — DX: Gastro-esophageal reflux disease without esophagitis: K21.9

## 2013-07-12 HISTORY — DX: Hypothyroidism, unspecified: E03.9

## 2013-07-12 HISTORY — DX: Congenital malformation of lung, unspecified: Q33.9

## 2013-07-12 HISTORY — DX: Nocturia: R35.1

## 2013-07-12 HISTORY — DX: Anemia, unspecified: D64.9

## 2013-07-12 HISTORY — DX: Type 2 diabetes mellitus without complications: E11.9

## 2013-07-12 HISTORY — DX: Polyp of colon: K63.5

## 2013-07-12 HISTORY — DX: Cyst of kidney, acquired: N28.1

## 2013-07-12 HISTORY — DX: Reserved for inherently not codable concepts without codable children: IMO0001

## 2013-07-12 HISTORY — DX: Malignant (primary) neoplasm, unspecified: C80.1

## 2013-07-12 HISTORY — DX: Personal history of other malignant neoplasm of skin: Z85.828

## 2013-07-12 HISTORY — DX: Rash and other nonspecific skin eruption: R21

## 2013-07-12 HISTORY — DX: Nausea with vomiting, unspecified: R11.2

## 2013-07-12 HISTORY — DX: Essential (primary) hypertension: I10

## 2013-07-12 HISTORY — DX: Major depressive disorder, single episode, unspecified: F32.9

## 2013-07-12 HISTORY — DX: Personal history of other diseases of the musculoskeletal system and connective tissue: Z87.39

## 2013-07-12 HISTORY — DX: Depression, unspecified: F32.A

## 2013-07-12 HISTORY — DX: Hyperlipidemia, unspecified: E78.5

## 2013-07-12 HISTORY — DX: Other specified postprocedural states: Z98.890

## 2013-07-12 LAB — URINALYSIS, ROUTINE W REFLEX MICROSCOPIC
Ketones, ur: NEGATIVE mg/dL
Leukocytes, UA: NEGATIVE
Nitrite: NEGATIVE
Protein, ur: NEGATIVE mg/dL
Urobilinogen, UA: 0.2 mg/dL (ref 0.0–1.0)

## 2013-07-12 LAB — CBC
MCH: 27.6 pg (ref 26.0–34.0)
MCHC: 31.9 g/dL (ref 30.0–36.0)
Platelets: 357 10*3/uL (ref 150–400)
RBC: 4.57 MIL/uL (ref 3.87–5.11)
RDW: 14.9 % (ref 11.5–15.5)

## 2013-07-12 LAB — BASIC METABOLIC PANEL
BUN: 25 mg/dL — ABNORMAL HIGH (ref 6–23)
CO2: 24 mEq/L (ref 19–32)
Calcium: 9.5 mg/dL (ref 8.4–10.5)
Creatinine, Ser: 1.23 mg/dL — ABNORMAL HIGH (ref 0.50–1.10)
GFR calc non Af Amer: 38 mL/min — ABNORMAL LOW (ref >90)
Glucose, Bld: 108 mg/dL — ABNORMAL HIGH (ref 70–99)
Sodium: 135 mEq/L (ref 135–145)

## 2013-07-12 LAB — PROTIME-INR
INR: 0.92 (ref 0.00–1.49)
Prothrombin Time: 12.2 seconds (ref 11.6–15.2)

## 2013-07-12 LAB — SURGICAL PCR SCREEN: MRSA, PCR: NEGATIVE

## 2013-07-12 NOTE — Patient Instructions (Addendum)
Lauren Rose  07/12/2013                           YOUR PROCEDURE IS SCHEDULED ON: 07/20/13               PLEASE REPORT TO SHORT STAY CENTER AT : 10:15 AM               CALL THIS NUMBER IF ANY PROBLEMS THE DAY OF SURGERY :               [phone]                      REMEMBER:   Do not eat food or drink liquids AFTER MIDNIGHT  May have clear liquids UNTIL 6 HOURS BEFORE SURGERY (6:45 AM)  Clear liquids include soda, tea, black coffee, apple or grape juice, broth.  Take these medicines the morning of surgery with A SIP OF WATER:  SYNTHROID / AMLODIPINE / PAROXETINE / ATENOLOL / OMEPRAZOLE   Do not wear jewelry, make-up   Do not wear lotions, powders, or perfumes.   Do not shave legs or underarms 12 hrs. before surgery (men may shave face)  Do not bring valuables to the hospital.  Contacts, dentures or bridgework may not be worn into surgery.  Leave suitcase in the car. After surgery it may be brought to your room.  For patients admitted to the hospital more than one night, checkout time is 11:00                          The day of discharge.   Patients discharged the day of surgery will not be allowed to drive home                             If going home same day of surgery, must have someone stay with you first                           24 hrs at home and arrange for some one to drive you home from hospital.    Special Instructions:   Please read over the following fact sheets that you were given:               1. MRSA  INFORMATION                      2. [facility] PREPARING FOR SURGERY SHEET                                                X_____________________________________________________________________        Failure to follow these instructions may result in cancellation of your surgery

## 2013-07-20 ENCOUNTER — Encounter (HOSPITAL_COMMUNITY): Payer: Self-pay | Admitting: *Deleted

## 2013-07-20 ENCOUNTER — Inpatient Hospital Stay (HOSPITAL_COMMUNITY): Payer: Medicare Other

## 2013-07-20 ENCOUNTER — Encounter (HOSPITAL_COMMUNITY)
Admission: RE | Disposition: A | Discharge status: Home Health | Payer: Self-pay | Source: Ambulatory Visit | Attending: Orthopaedic Surgery

## 2013-07-20 ENCOUNTER — Inpatient Hospital Stay (HOSPITAL_COMMUNITY): Payer: Medicare Other | Admitting: Anesthesiology

## 2013-07-20 ENCOUNTER — Encounter (HOSPITAL_COMMUNITY): Payer: Medicare Other | Admitting: Anesthesiology

## 2013-07-20 ENCOUNTER — Inpatient Hospital Stay (HOSPITAL_COMMUNITY)
Admission: RE | Admit: 2013-07-20 | Discharge: 2013-07-24 | DRG: 470 | Disposition: A | Discharge status: Home Health | Payer: Medicare Other | Source: Ambulatory Visit | Attending: Orthopaedic Surgery | Admitting: Orthopaedic Surgery

## 2013-07-20 DIAGNOSIS — Z860100 Personal history of colon polyps, unspecified: Secondary | ICD-10-CM

## 2013-07-20 DIAGNOSIS — I10 Essential (primary) hypertension: Secondary | ICD-10-CM | POA: Diagnosis present

## 2013-07-20 DIAGNOSIS — E039 Hypothyroidism, unspecified: Secondary | ICD-10-CM | POA: Diagnosis present

## 2013-07-20 DIAGNOSIS — N289 Disorder of kidney and ureter, unspecified: Secondary | ICD-10-CM | POA: Diagnosis present

## 2013-07-20 DIAGNOSIS — E785 Hyperlipidemia, unspecified: Secondary | ICD-10-CM | POA: Diagnosis present

## 2013-07-20 DIAGNOSIS — M169 Osteoarthritis of hip, unspecified: Secondary | ICD-10-CM

## 2013-07-20 DIAGNOSIS — Z85850 Personal history of malignant neoplasm of thyroid: Secondary | ICD-10-CM

## 2013-07-20 DIAGNOSIS — M1610 Unilateral primary osteoarthritis, unspecified hip: Principal | ICD-10-CM | POA: Diagnosis present

## 2013-07-20 DIAGNOSIS — D62 Acute posthemorrhagic anemia: Secondary | ICD-10-CM | POA: Diagnosis not present

## 2013-07-20 DIAGNOSIS — E119 Type 2 diabetes mellitus without complications: Secondary | ICD-10-CM | POA: Diagnosis present

## 2013-07-20 HISTORY — PX: TOTAL HIP ARTHROPLASTY: SHX124

## 2013-07-20 LAB — GLUCOSE, CAPILLARY: Glucose-Capillary: 121 mg/dL — ABNORMAL HIGH (ref 70–99)

## 2013-07-20 LAB — ABO/RH: ABO/RH(D): A POS

## 2013-07-20 LAB — TYPE AND SCREEN

## 2013-07-20 SURGERY — ARTHROPLASTY, HIP, TOTAL, ANTERIOR APPROACH
Anesthesia: Spinal | Site: Hip | Laterality: Left | Wound class: Clean

## 2013-07-20 MED ORDER — ASPIRIN EC 325 MG PO TBEC
325.0000 mg | DELAYED_RELEASE_TABLET | Freq: Every day | ORAL | Status: DC
  Administered 2013-07-21 – 2013-07-24 (×4): 325 mg via ORAL
  Filled 2013-07-20 (×5): qty 1

## 2013-07-20 MED ORDER — ADULT MULTIVITAMIN W/MINERALS CH
1.0000 | ORAL_TABLET | Freq: Every day | ORAL | Status: DC
  Administered 2013-07-20 – 2013-07-24 (×5): 1 via ORAL
  Filled 2013-07-20 (×5): qty 1

## 2013-07-20 MED ORDER — SIMVASTATIN 10 MG PO TABS
10.0000 mg | ORAL_TABLET | Freq: Every evening | ORAL | Status: DC
  Administered 2013-07-20 – 2013-07-23 (×4): 10 mg via ORAL
  Filled 2013-07-20 (×5): qty 1

## 2013-07-20 MED ORDER — ZOLPIDEM TARTRATE 5 MG PO TABS
5.0000 mg | ORAL_TABLET | Freq: Every evening | ORAL | Status: DC | PRN
  Filled 2013-07-20: qty 1

## 2013-07-20 MED ORDER — CALCIUM CARBONATE-VITAMIN D 500-200 MG-UNIT PO TABS
1.0000 | ORAL_TABLET | Freq: Every day | ORAL | Status: DC
  Administered 2013-07-21 – 2013-07-24 (×4): 1 via ORAL
  Filled 2013-07-20 (×5): qty 1

## 2013-07-20 MED ORDER — CEFAZOLIN SODIUM-DEXTROSE 2-3 GM-% IV SOLR
INTRAVENOUS | Status: AC
  Filled 2013-07-20: qty 50

## 2013-07-20 MED ORDER — AMLODIPINE BESYLATE 10 MG PO TABS
10.0000 mg | ORAL_TABLET | Freq: Every morning | ORAL | Status: DC
  Administered 2013-07-21 – 2013-07-24 (×4): 10 mg via ORAL
  Filled 2013-07-20 (×4): qty 1

## 2013-07-20 MED ORDER — ATENOLOL 12.5 MG HALF TABLET
12.5000 mg | ORAL_TABLET | Freq: Two times a day (BID) | ORAL | Status: DC
  Administered 2013-07-20 – 2013-07-24 (×8): 12.5 mg via ORAL
  Filled 2013-07-20 (×9): qty 1

## 2013-07-20 MED ORDER — METHOCARBAMOL 500 MG PO TABS: 500.0000 mg | ORAL_TABLET | Freq: Four times a day (QID) | ORAL | Status: DC | PRN

## 2013-07-20 MED ORDER — CEFAZOLIN SODIUM 1-5 GM-% IV SOLN
1.0000 g | Freq: Four times a day (QID) | INTRAVENOUS | Status: AC
  Administered 2013-07-20 – 2013-07-21 (×2): 1 g via INTRAVENOUS
  Filled 2013-07-20 (×2): qty 50

## 2013-07-20 MED ORDER — PANTOPRAZOLE SODIUM 40 MG PO TBEC
40.0000 mg | DELAYED_RELEASE_TABLET | Freq: Every day | ORAL | Status: DC
  Administered 2013-07-21 – 2013-07-24 (×4): 40 mg via ORAL
  Filled 2013-07-20 (×4): qty 1

## 2013-07-20 MED ORDER — HYDROCHLOROTHIAZIDE 25 MG PO TABS
25.0000 mg | ORAL_TABLET | Freq: Every morning | ORAL | Status: DC
  Administered 2013-07-21 – 2013-07-24 (×4): 25 mg via ORAL
  Filled 2013-07-20 (×4): qty 1

## 2013-07-20 MED ORDER — POLYETHYLENE GLYCOL 3350 17 G PO PACK
17.0000 g | PACK | Freq: Every day | ORAL | Status: DC | PRN
  Administered 2013-07-21: 20:00:00 17 g via ORAL

## 2013-07-20 MED ORDER — ONDANSETRON HCL 4 MG/2ML IJ SOLN: 4.0000 mg | Freq: Four times a day (QID) | INTRAMUSCULAR | Status: DC | PRN

## 2013-07-20 MED ORDER — FENTANYL CITRATE 0.05 MG/ML IJ SOLN: 25.0000 ug | INTRAMUSCULAR | Status: DC | PRN

## 2013-07-20 MED ORDER — DOCUSATE SODIUM 100 MG PO CAPS
100.0000 mg | ORAL_CAPSULE | Freq: Two times a day (BID) | ORAL | Status: DC
  Administered 2013-07-20 – 2013-07-24 (×8): 100 mg via ORAL

## 2013-07-20 MED ORDER — HYDROMORPHONE HCL PF 1 MG/ML IJ SOLN: 0.5000 mg | INTRAMUSCULAR | Status: DC | PRN

## 2013-07-20 MED ORDER — DEXAMETHASONE SODIUM PHOSPHATE 10 MG/ML IJ SOLN
INTRAMUSCULAR | Status: DC | PRN
  Administered 2013-07-20: 10 mg via INTRAVENOUS

## 2013-07-20 MED ORDER — LEVOTHYROXINE SODIUM 125 MCG PO TABS
125.0000 ug | ORAL_TABLET | Freq: Every day | ORAL | Status: DC
  Administered 2013-07-21 – 2013-07-24 (×4): 125 ug via ORAL
  Filled 2013-07-20 (×5): qty 1

## 2013-07-20 MED ORDER — ACETAMINOPHEN 325 MG PO TABS
650.0000 mg | ORAL_TABLET | Freq: Four times a day (QID) | ORAL | Status: DC | PRN
  Administered 2013-07-22 (×2): 650 mg via ORAL
  Filled 2013-07-20 (×2): qty 2

## 2013-07-20 MED ORDER — LINAGLIPTIN 5 MG PO TABS
5.0000 mg | ORAL_TABLET | Freq: Every day | ORAL | Status: DC
  Administered 2013-07-20 – 2013-07-24 (×5): 5 mg via ORAL
  Filled 2013-07-20 (×5): qty 1

## 2013-07-20 MED ORDER — CEFAZOLIN SODIUM-DEXTROSE 2-3 GM-% IV SOLR
2.0000 g | INTRAVENOUS | Status: AC
  Administered 2013-07-20: 2 g via INTRAVENOUS

## 2013-07-20 MED ORDER — TERBINAFINE HCL 250 MG PO TABS
250.0000 mg | ORAL_TABLET | Freq: Every day | ORAL | Status: DC
  Administered 2013-07-20 – 2013-07-24 (×5): 250 mg via ORAL
  Filled 2013-07-20 (×6): qty 1

## 2013-07-20 MED ORDER — MENTHOL 3 MG MT LOZG: 1.0000 | LOZENGE | OROMUCOSAL | Status: DC | PRN

## 2013-07-20 MED ORDER — PAROXETINE HCL 10 MG PO TABS
10.0000 mg | ORAL_TABLET | Freq: Every morning | ORAL | Status: DC
  Administered 2013-07-21 – 2013-07-24 (×4): 10 mg via ORAL
  Filled 2013-07-20 (×4): qty 1

## 2013-07-20 MED ORDER — EPHEDRINE SULFATE 50 MG/ML IJ SOLN
INTRAMUSCULAR | Status: DC | PRN
  Administered 2013-07-20 (×3): 5 mg via INTRAVENOUS

## 2013-07-20 MED ORDER — METHOCARBAMOL 100 MG/ML IJ SOLN
500.0000 mg | Freq: Four times a day (QID) | INTRAVENOUS | Status: DC | PRN
  Administered 2013-07-20: 500 mg via INTRAVENOUS
  Filled 2013-07-20: qty 5

## 2013-07-20 MED ORDER — 0.9 % SODIUM CHLORIDE (POUR BTL) OPTIME
TOPICAL | Status: DC | PRN
  Administered 2013-07-20: 1000 mL

## 2013-07-20 MED ORDER — OXYCODONE HCL 5 MG PO TABS
5.0000 mg | ORAL_TABLET | ORAL | Status: DC | PRN
Start: 2013-07-20 — End: 2013-07-22

## 2013-07-20 MED ORDER — PHENOL 1.4 % MT LIQD: 1.0000 | OROMUCOSAL | Status: DC | PRN

## 2013-07-20 MED ORDER — PROMETHAZINE HCL 25 MG/ML IJ SOLN: 6.2500 mg | INTRAMUSCULAR | Status: DC | PRN

## 2013-07-20 MED ORDER — DIPHENHYDRAMINE HCL 12.5 MG/5ML PO ELIX
12.5000 mg | ORAL_SOLUTION | ORAL | Status: DC | PRN
  Filled 2013-07-20: qty 10

## 2013-07-20 MED ORDER — ACETAMINOPHEN 650 MG RE SUPP
650.0000 mg | Freq: Four times a day (QID) | RECTAL | Status: DC | PRN
  Filled 2013-07-20: qty 1

## 2013-07-20 MED ORDER — SODIUM CHLORIDE 0.9 % IR SOLN
Status: DC | PRN
  Administered 2013-07-20: 1000 mL

## 2013-07-20 MED ORDER — BUPIVACAINE HCL (PF) 0.5 % IJ SOLN
INTRAMUSCULAR | Status: AC
  Filled 2013-07-20: qty 30

## 2013-07-20 MED ORDER — ONDANSETRON HCL 4 MG PO TABS
4.0000 mg | ORAL_TABLET | Freq: Four times a day (QID) | ORAL | Status: DC | PRN
  Filled 2013-07-20: qty 1

## 2013-07-20 MED ORDER — PROPOFOL INFUSION 10 MG/ML OPTIME
INTRAVENOUS | Status: DC | PRN
  Administered 2013-07-20: 50 ug/kg/min via INTRAVENOUS

## 2013-07-20 MED ORDER — SODIUM CHLORIDE 0.9 % IV SOLN
INTRAVENOUS | Status: DC
Start: 2013-07-20 — End: 2013-07-22
  Administered 2013-07-20 – 2013-07-21 (×2): via INTRAVENOUS

## 2013-07-20 MED ORDER — LACTATED RINGERS IV SOLN
INTRAVENOUS | Status: DC
  Administered 2013-07-20 (×2): via INTRAVENOUS
  Administered 2013-07-20: 1000 mL via INTRAVENOUS

## 2013-07-20 MED ORDER — METOCLOPRAMIDE HCL 5 MG/ML IJ SOLN: 5.0000 mg | Freq: Three times a day (TID) | INTRAMUSCULAR | Status: DC | PRN

## 2013-07-20 MED ORDER — ONDANSETRON HCL 4 MG/2ML IJ SOLN
INTRAMUSCULAR | Status: DC | PRN
  Administered 2013-07-20: 4 mg via INTRAMUSCULAR

## 2013-07-20 MED ORDER — ALUM & MAG HYDROXIDE-SIMETH 200-200-20 MG/5ML PO SUSP: 30.0000 mL | ORAL | Status: DC | PRN

## 2013-07-20 MED ORDER — METOCLOPRAMIDE HCL 5 MG PO TABS
5.0000 mg | ORAL_TABLET | Freq: Three times a day (TID) | ORAL | Status: DC | PRN
Start: 2013-07-20 — End: 2013-07-24
  Filled 2013-07-20: qty 2

## 2013-07-20 SURGICAL SUPPLY — 39 items
BAG SPEC THK2 15X12 ZIP CLS (MISCELLANEOUS)
BAG ZIPLOCK 12X15 (MISCELLANEOUS) ×2 IMPLANT
BLADE SAW SGTL 18X1.27X75 (BLADE) ×2 IMPLANT
CAPT HIP PF MOP ×1 IMPLANT
CELLS DAT CNTRL 66122 CELL SVR (MISCELLANEOUS) ×1 IMPLANT
DRAPE C-ARM 42X120 X-RAY (DRAPES) ×2 IMPLANT
DRAPE STERI IOBAN 125X83 (DRAPES) ×2 IMPLANT
DRAPE U-SHAPE 47X51 STRL (DRAPES) ×6 IMPLANT
DRSG AQUACEL AG ADV 3.5X10 (GAUZE/BANDAGES/DRESSINGS) ×2 IMPLANT
DURAPREP 26ML APPLICATOR (WOUND CARE) ×2 IMPLANT
ELECT BLADE TIP CTD 4 INCH (ELECTRODE) ×2 IMPLANT
ELECT REM PT RETURN 9FT ADLT (ELECTROSURGICAL) ×2
ELECTRODE REM PT RTRN 9FT ADLT (ELECTROSURGICAL) ×1 IMPLANT
FACESHIELD LNG OPTICON STERILE (SAFETY) ×7 IMPLANT
GAUZE XEROFORM 5X9 LF (GAUZE/BANDAGES/DRESSINGS) ×2 IMPLANT
GLOVE BIO SURGEON STRL SZ7.5 (GLOVE) ×2 IMPLANT
GLOVE BIOGEL PI IND STRL 8 (GLOVE) ×2 IMPLANT
GLOVE BIOGEL PI INDICATOR 8 (GLOVE) ×2
GLOVE ECLIPSE 8.0 STRL XLNG CF (GLOVE) ×2 IMPLANT
GOWN STRL REIN XL XLG (GOWN DISPOSABLE) ×4 IMPLANT
HANDPIECE INTERPULSE COAX TIP (DISPOSABLE) ×2
HOOD PEEL AWAY FACE SHEILD DIS (HOOD) ×1 IMPLANT
KIT BASIN OR (CUSTOM PROCEDURE TRAY) ×2 IMPLANT
PACK TOTAL JOINT (CUSTOM PROCEDURE TRAY) ×2 IMPLANT
PADDING CAST COTTON 6X4 STRL (CAST SUPPLIES) ×2 IMPLANT
RETRACTOR WND ALEXIS 18 MED (MISCELLANEOUS) ×1 IMPLANT
RTRCTR WOUND ALEXIS 18CM MED (MISCELLANEOUS) ×2
SET HNDPC FAN SPRY TIP SCT (DISPOSABLE) ×1 IMPLANT
STAPLER VISISTAT 35W (STAPLE) ×2 IMPLANT
SUT ETHIBOND NAB CT1 #1 30IN (SUTURE) ×4 IMPLANT
SUT ETHILON 3 0 PS 1 (SUTURE) ×1 IMPLANT
SUT MNCRL AB 4-0 PS2 18 (SUTURE) ×1 IMPLANT
SUT VIC AB 0 CT1 36 (SUTURE) ×2 IMPLANT
SUT VIC AB 1 CT1 36 (SUTURE) ×4 IMPLANT
SUT VIC AB 2-0 CT1 27 (SUTURE) ×4
SUT VIC AB 2-0 CT1 TAPERPNT 27 (SUTURE) ×2 IMPLANT
TOWEL OR 17X26 10 PK STRL BLUE (TOWEL DISPOSABLE) ×2 IMPLANT
TOWEL OR NON WOVEN STRL DISP B (DISPOSABLE) ×2 IMPLANT
TRAY FOLEY CATH 14FRSI W/METER (CATHETERS) ×2 IMPLANT

## 2013-07-20 NOTE — H&P (Signed)
TOTAL HIP ADMISSION H&P  Patient is admitted for left total hip arthroplasty.  Subjective:  Chief Complaint: left hip pain  HPI: Lauren Rose, 79 y.o. female, has a history of pain and functional disability in the left hip(s) due to arthritis and patient has failed non-surgical conservative treatments for greater than 12 weeks to include NSAID's and/or analgesics, corticosteriod injections, use of assistive devices and activity modification.  Onset of symptoms was gradual starting 5 years ago with gradually worsening course since that time.The patient noted no past surgery on the left hip(s).  Patient currently rates pain in the left hip at 10 out of 10 with activity. Patient has night pain, worsening of pain with activity and weight bearing, trendelenberg gait, pain that interfers with activities of daily living, pain with passive range of motion and crepitus. Patient has evidence of subchondral cysts, subchondral sclerosis, periarticular osteophytes and joint space narrowing by imaging studies. This condition presents safety issues increasing the risk of falls.  There is no current active infection.  Patient Active Problem List   Diagnosis Date Noted  . Degenerative arthritis of hip, left 07/20/2013   Past Medical History  Diagnosis Date  . Colon polyps   . PONV (postoperative nausea and vomiting)   . Hypertension   . Hyperlipidemia   . 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 Cyndra Numbers every 6 months x 2 yrs  . Arthritis   . History of gout   . History of skin cancer   . Rash     yeast infection under breasts  . GERD (gastroesophageal reflux disease)   . Kidney cysts     followed by Dr. Retta Diones  . Frequency   . Nocturia   . Anemia   . Diabetes mellitus without complication   . Hypothyroidism   . Depression   . Cancer     hx of thyroid cancer    Past Surgical History  Procedure Laterality Date  . Colon surgery  2004    colon polyps then infection srg  x3  . Thyroidectomy  2006  . Hernia repair  2005    No prescriptions prior to admission   No Known Allergies  History  Substance Use Topics  . Smoking status: Never Smoker   . Smokeless tobacco: Not on file  . Alcohol Use: No    No family history on file.   Review of Systems  All other systems reviewed and are negative.    Objective:  Physical Exam  Constitutional: She is oriented to person, place, and time. She appears well-developed and well-nourished.  HENT:  Head: Normocephalic and atraumatic.  Eyes: EOM are normal. Pupils are equal, round, and reactive to light.  Neck: Normal range of motion. Neck supple.  Cardiovascular: Normal rate.   Respiratory: Effort normal and breath sounds normal.  GI: Soft. Bowel sounds are normal.  Musculoskeletal:       Left hip: She exhibits decreased range of motion, decreased strength, bony tenderness and crepitus.  Neurological: She is alert and oriented to person, place, and time.  Skin: Skin is warm and dry.  Psychiatric: She has a normal mood and affect.    Vital signs in last 24 hours:    Labs:   There is no height or weight on file to calculate BMI.   Imaging Review Plain radiographs demonstrate severe degenerative joint disease of the left hip(s). The bone quality appears to be fair for age and reported  activity level.  Assessment/Plan:  End stage arthritis, left hip(s)  The patient history, physical examination, clinical judgement of the provider and imaging studies are consistent with end stage degenerative joint disease of the left hip(s) and total hip arthroplasty is deemed medically necessary. The treatment options including medical management, injection therapy, arthroscopy and arthroplasty were discussed at length. The risks and benefits of total hip arthroplasty were presented and reviewed. The risks due to aseptic loosening, infection, stiffness, dislocation/subluxation,  thromboembolic complications and other  imponderables were discussed.  The patient acknowledged the explanation, agreed to proceed with the plan and consent was signed. Patient is being admitted for inpatient treatment for surgery, pain control, PT, OT, prophylactic antibiotics, VTE prophylaxis, progressive ambulation and ADL's and discharge planning.The patient is planning to be discharged to skilled nursing facility

## 2013-07-20 NOTE — Transfer of Care (Signed)
Immediate Anesthesia Transfer of Care Note  Patient: Lauren Rose  Procedure(s) Performed: Procedure(s): LEFT TOTAL HIP ARTHROPLASTY ANTERIOR APPROACH (Left)  Patient Location: PACU  Anesthesia Type:Regional and Spinal  Level of Consciousness: awake, alert , sedated and patient cooperative  Airway & Oxygen Therapy: Patient Spontanous Breathing and Patient connected to nasal cannula oxygen  Post-op Assessment: Report given to PACU RN and Post -op Vital signs reviewed and stable  Post vital signs: Reviewed and stable  Complications: No apparent anesthesia complications

## 2013-07-20 NOTE — Anesthesia Preprocedure Evaluation (Addendum)
Anesthesia Evaluation  Patient identified by MRN, date of birth, ID band Patient awake    Reviewed: Allergy & Precautions, H&P , NPO status , Patient's Chart, lab work & pertinent test results  History of Anesthesia Complications (+) PONV  Airway Mallampati: II TM Distance: <3 FB Neck ROM: Limited    Dental no notable dental hx.    Pulmonary neg pulmonary ROS,  breath sounds clear to auscultation  Pulmonary exam normal       Cardiovascular hypertension, Pt. on medications Rhythm:Regular Rate:Normal     Neuro/Psych negative neurological ROS  negative psych ROS   GI/Hepatic negative GI ROS, Neg liver ROS,   Endo/Other  diabetes, Oral Hypoglycemic AgentsHypothyroidism   Renal/GU Renal InsufficiencyRenal disease  negative genitourinary   Musculoskeletal negative musculoskeletal ROS (+)   Abdominal   Peds negative pediatric ROS (+)  Hematology negative hematology ROS (+)   Anesthesia Other Findings   Reproductive/Obstetrics negative OB ROS                           Anesthesia Physical Anesthesia Plan  ASA: III  Anesthesia Plan: Spinal   Post-op Pain Management:    Induction: Intravenous  Airway Management Planned: Simple Face Mask  Additional Equipment:   Intra-op Plan:   Post-operative Plan:   Informed Consent: I have reviewed the patients History and Physical, chart, labs and discussed the procedure including the risks, benefits and alternatives for the proposed anesthesia with the patient or authorized representative who has indicated his/her understanding and acceptance.   Dental advisory given  Plan Discussed with: CRNA and Surgeon  Anesthesia Plan Comments:        Anesthesia Quick Evaluation

## 2013-07-20 NOTE — Brief Op Note (Signed)
07/20/2013  4:04 PM  PATIENT:  Lauren Rose  79 y.o. female  PRE-OPERATIVE DIAGNOSIS:  Severe osteoarthritis left hip  POST-OPERATIVE DIAGNOSIS:  Severe osteoarthritis left hip  PROCEDURE:  Procedure(s): LEFT TOTAL HIP ARTHROPLASTY ANTERIOR APPROACH (Left)  SURGEON:  Surgeon(s) and Role:    * Kathryne Hitch, MD - Primary  PHYSICIAN ASSISTANT: Rexene Edison, PA-C  ANESTHESIA:   spinal  EBL:  Total I/O In: 2000 [I.V.:2000] Out: 1000 [Urine:650; Blood:350]  BLOOD ADMINISTERED:none  DRAINS: none   LOCAL MEDICATIONS USED:  NONE  SPECIMEN:  No Specimen  DISPOSITION OF SPECIMEN:  N/A  COUNTS:  YES  TOURNIQUET:  * No tourniquets in log *  DICTATION: .Other Dictation: Dictation Number [id number]  PLAN OF CARE: Admit to inpatient   PATIENT DISPOSITION:  PACU - hemodynamically stable.   Delay start of Pharmacological VTE agent (>24hrs) due to surgical blood loss or risk of bleeding: no

## 2013-07-21 LAB — CBC
HCT: 29.3 % — ABNORMAL LOW (ref 36.0–46.0)
RBC: 3.42 MIL/uL — ABNORMAL LOW (ref 3.87–5.11)
RDW: 14.7 % (ref 11.5–15.5)
WBC: 9.1 10*3/uL (ref 4.0–10.5)

## 2013-07-21 LAB — BASIC METABOLIC PANEL
BUN: 24 mg/dL — ABNORMAL HIGH (ref 6–23)
CO2: 23 mEq/L (ref 19–32)
Creatinine, Ser: 1.2 mg/dL — ABNORMAL HIGH (ref 0.50–1.10)
GFR calc Af Amer: 45 mL/min — ABNORMAL LOW (ref >90)
GFR calc non Af Amer: 39 mL/min — ABNORMAL LOW (ref >90)
Glucose, Bld: 178 mg/dL — ABNORMAL HIGH (ref 70–99)
Potassium: 4.4 mEq/L (ref 3.5–5.1)

## 2013-07-21 NOTE — Evaluation (Signed)
Physical Therapy Evaluation Patient Details Name: Lauren Rose MRN: [id number] DOB: Oct 28, 1921 Today's Date: 07/21/2013 Time: 7425-9563 PT Time Calculation (min): 24 min  PT Assessment / Plan / Recommendation History of Present Illness  LTHA direct anterior approach  Clinical Impression  Pt will benefit from PT in acute setting to address deficits below    PT Assessment  Patient needs continued PT services    Follow Up Recommendations  Home health PT    Does the patient have the potential to tolerate intense rehabilitation      Barriers to Discharge        Equipment Recommendations  None recommended by PT    Recommendations for Other Services     Frequency 7X/week    Precautions / Restrictions Precautions Precautions: None Restrictions Other Position/Activity Restrictions: WBAT   Pertinent Vitals/Pain No c/o pain, just soreness      Mobility  Bed Mobility Bed Mobility: Supine to Sit Supine to Sit: 4: Min assist Sitting - Scoot to Edge of Bed: 4: Min assist Details for Bed Mobility Assistance: cues for technique Transfers Transfers: Sit to Stand;Stand to Sit Sit to Stand: 4: Min assist;With upper extremity assist;From bed;4: Min guard;From chair/3-in-1 Stand to Sit: 4: Min guard;With upper extremity assist;With armrests;To chair/3-in-1;To bed Details for Transfer Assistance: verbal cues for hand placement and overall safety Ambulation/Gait Ambulation/Gait Assistance: 4: Min assist;4: Min guard Ambulation Distance (Feet): 120 Feet Assistive device: Rolling walker Ambulation/Gait Assistance Details: verbal cues for posture and sequence Gait Pattern: Step-to pattern;Trunk flexed    Exercises     PT Diagnosis: Difficulty walking  PT Problem List: Decreased range of motion;Decreased activity tolerance;Decreased balance;Decreased mobility;Decreased knowledge of use of DME PT Treatment Interventions: Functional mobility training;Gait training;DME  instruction;Therapeutic activities;Therapeutic exercise;Patient/family education     PT Goals(Current goals can be found in the care plan section) Acute Rehab PT Goals PT Goal Formulation: With patient Time For Goal Achievement: 07/28/13 Potential to Achieve Goals: Good  Visit Information  Last PT Received On: 07/21/13 Assistance Needed: +1 History of Present Illness: LTHA direct anterior approach       Prior Functioning  Home Living Family/patient expects to be discharged to:: Private residence Living Arrangements: Alone Available Help at Discharge: Family;Available PRN/intermittently Type of Home: House Home Access: Ramped entrance Home Layout: One level Home Equipment: Shower seat;Bedside commode;Walker - 2 wheels;Cane - single point Prior Function Level of Independence: Independent with assistive device(s) Comments: using cane x 6 wks prior to surgery Communication Communication: No difficulties Dominant Hand: Right    Cognition  Cognition Arousal/Alertness: Awake/alert Behavior During Therapy: WFL for tasks assessed/performed Overall Cognitive Status: Within Functional Limits for tasks assessed    Extremity/Trunk Assessment Upper Extremity Assessment Upper Extremity Assessment: Defer to OT evaluation Lower Extremity Assessment Lower Extremity Assessment: LLE deficits/detail LLE Deficits / Details: grossly 3+5   Balance    End of Session PT - End of Session Activity Tolerance: Patient tolerated treatment well Patient left: with call bell/phone within reach;in bed  GP     [facility] 07/21/2013, 4:31 PM

## 2013-07-21 NOTE — Progress Notes (Signed)
Subjective: 1 Day Post-Op Procedure(s) (LRB): LEFT TOTAL HIP ARTHROPLASTY ANTERIOR APPROACH (Left) Patient reports pain as mild.  No complaints.  Objective: Vital signs in last 24 hours: Temp:  [97.4 F (36.3 C)-98 F (36.7 C)] 97.6 F (36.4 C) (10/11 0522) Pulse Rate:  [59-66] 61 (10/11 0522) Resp:  [15-28] 16 (10/11 0522) BP: (121-166)/(49-68) 123/68 mmHg (10/11 0522) SpO2:  [94 %-98 %] 98 % (10/11 0522) Weight:  [67.132 kg (148 lb)] 67.132 kg (148 lb) (10/10 1721)   Intake/Output from previous day: 10/10 0701 - 10/11 0700 In: 4061.3 [I.V.:3906.3; IV Piggyback:155] Out: 2125 [Urine:1775; Blood:350] Intake/Output this shift: Total I/O In: -  Out: 150 [Urine:150]   Recent Labs  07/21/13 0500  HGB 9.4*    Recent Labs  07/21/13 0500  WBC 9.1  RBC 3.42*  HCT 29.3*  PLT 252    Recent Labs  07/21/13 0500  NA 135  K 4.4  CL 103  CO2 23  BUN 24*  CREATININE 1.20*  GLUCOSE 178*  CALCIUM 8.2*   No results found for this basename: LABPT, INR,  in the last 72 hours  Left leg: Neurovascular intact Sensation intact distally Intact pulses distally Dorsiflexion/Plantar flexion intact Incision: dressing C/D/I Compartment soft  Assessment/Plan: 1 Day Post-Op Procedure(s) (LRB): LEFT TOTAL HIP ARTHROPLASTY ANTERIOR APPROACH (Left) Up with therapy ABLA secondary to surgery Monitor Hgb and for symptoms of anemia. CLARK, GILBERT 07/21/2013, 10:18 AM

## 2013-07-21 NOTE — Evaluation (Signed)
Occupational Therapy Evaluation Patient Details Name: Lauren Rose MRN: [id number] DOB: May 31, 1922 Today's Date: 07/21/2013 Time: 3086-5784 OT Time Calculation (min): 37 min  OT Assessment / Plan / Recommendation History of present illness LTHA direct anterior approach   Clinical Impression   This 79 yo female doing wonderful, presents to acute OT with problems below. Will benefit from acute OT with follow up HHOT.    OT Assessment  Patient needs continued OT Services    Follow Up Recommendations  Home health OT (going home with intermittent A/S)       Equipment Recommendations  None recommended by OT       Frequency  Min 2X/week    Precautions / Restrictions Precautions Precautions: None Restrictions Weight Bearing Restrictions: No   Pertinent Vitals/Pain 3/10 anterior left thigh; repostioned    ADL  Eating/Feeding: Independent Where Assessed - Eating/Feeding: Chair Grooming: Wash/dry hands;Wash/dry face;Teeth care;Brushing hair;Applying makeup;Min guard Where Assessed - Grooming: Supported standing Upper Body Bathing: Set up Where Assessed - Upper Body Bathing: Unsupported sitting Lower Body Bathing: Minimal assistance Where Assessed - Lower Body Bathing: Unsupported sit to stand Upper Body Dressing: Set up Where Assessed - Upper Body Dressing: Unsupported sitting Lower Body Dressing: Moderate assistance Where Assessed - Lower Body Dressing: Unsupported standing Toilet Transfer: Min Pension scheme manager Method: Sit to Barista:  (Bed>bathroom>recliner) Toileting - Architect and Hygiene: Min guard Where Assessed - Engineer, mining and Hygiene: Sit to stand from 3-in-1 or toilet Equipment Used: Rolling walker Transfers/Ambulation Related to ADLs: Min A sit>stand; S  stand >sit; Min guard A ambulation with RW    OT Diagnosis: Generalized weakness;Acute pain  OT Problem List: Impaired balance (sitting and/or  standing);Decreased knowledge of use of DME or AE;Pain OT Treatment Interventions: Self-care/ADL training;DME and/or AE instruction;Patient/family education;Balance training   OT Goals(Current goals can be found in the care plan section) Acute Rehab OT Goals Patient Stated Goal: To go home OT Goal Formulation: With patient Time For Goal Achievement: 07/28/13 Potential to Achieve Goals: Good  Visit Information  Last OT Received On: 07/21/13 Assistance Needed: +1 History of Present Illness: LTHA direct anterior approach       Prior Functioning     Home Living Family/patient expects to be discharged to:: Private residence Living Arrangements: Alone Available Help at Discharge: Family;Available PRN/intermittently Type of Home: House Home Access: Ramped entrance Home Layout: One level Home Equipment: Shower seat;Bedside commode;Walker - 2 wheels;Cane - single point (sock aid) Prior Function Level of Independence: Independent Communication Communication: No difficulties Dominant Hand: Right         Vision/Perception Vision - History Patient Visual Report: No change from baseline   Cognition  Cognition Arousal/Alertness: Awake/alert Behavior During Therapy: WFL for tasks assessed/performed Overall Cognitive Status: Within Functional Limits for tasks assessed    Extremity/Trunk Assessment Upper Extremity Assessment Upper Extremity Assessment: Overall WFL for tasks assessed (arthritis)     Mobility Bed Mobility Bed Mobility: Supine to Sit;Sitting - Scoot to Edge of Bed Supine to Sit: 5: Supervision;HOB elevated Sitting - Scoot to [place] of Bed: 5: Supervision Transfers Transfers: Sit to Stand;Stand to Sit Sit to Stand: 4: Min assist;With upper extremity assist;From bed Stand to Sit: 4: Min guard;With upper extremity assist;With armrests;To chair/3-in-1        Balance Balance Balance Assessed: Yes Dynamic Sitting Balance Dynamic Sitting - Balance Support: No  upper extremity supported;During functional activity (grooming tasks at sink) Dynamic Sitting - Level of Assistance:  (minguard A)  Dynamic Sitting - Comments: 5 minutes   End of Session OT - End of Session Equipment Utilized During Treatment: Rolling walker Activity Tolerance: Patient tolerated treatment well Patient left: in chair;with call bell/phone within reach Nurse Communication: Mobility status       Evette Georges [phone] 07/21/2013, 9:22 AM

## 2013-07-21 NOTE — Progress Notes (Signed)
CSW reviewed chart and PT REC is HHPT.   No further needs at this time.   Please re-consult if Pt status changes.   Leron Croak, LCSWA [facility]  [phone]

## 2013-07-21 NOTE — Op Note (Signed)
Lauren Rose, Lauren Rose NO.:  [id number]  MEDICAL RECORD NO.:  [id number]  LOCATION:  1602                         FACILITY:  [facility]  PHYSICIAN:  Vanita Panda. Magnus Ivan, M.D.DATE OF BIRTH:  11/20/1921  DATE OF PROCEDURE:  07/20/2013 DATE OF DISCHARGE:                              OPERATIVE REPORT   PREOPERATIVE DIAGNOSIS:  Severe end-stage arthritis and degenerative joint disease, left hip.  POSTOPERATIVE DIAGNOSIS:  Severe end-stage arthritis and degenerative joint disease, left hip.  PROCEDURE:  Left total hip arthroplasty direct anterior approach.  IMPLANTS:  DePuy Sector Gription acetabular component size 48, size 32+ 4 neutral polyethylene liner, size 12 Corail femoral component with varus offset, size 32+ 1 metal hip ball.  SURGEON:  Vanita Panda. Magnus Ivan, M.D.  ASSISTANT:  Kriste Basque, PA-C.  ANESTHESIA:  Spinal.  BLOOD LOSS:  Less than 500 mL.  ANTIBIOTICS:  2 g of IV Ancef.  COMPLICATIONS:  None.  INDICATIONS:  Lauren Rose is a very pleasant 79 year old female with severe debilitating end-stage arthritis of her left hip.  It has gotten to where it has greatly affects her activities of daily living.  Her pain is quite debilitating for her.  Her mobility is limited and her quality of life is limited.  I talked to her and her family.  Her primary care physician who sent her to me has also recommended total hip arthroplasty.  She has been cleared from that standpoint and from cardiac standpoint.  At this point, due to the severe impact it has had on her life, she wishes to proceed with a total hip arthroplasty.  The risks include acute blood loss anemia, nerve and vessel injury, fracture, infection, DVT, as well as risk of death given her age.  The goals are improvement with mobility, decreased pain, and overall improved quality of life.  PROCEDURE DESCRIPTION:  After informed consent was obtained, appropriate left hip was marked.  She was  brought to the operating room and while she was on her stretcher spinal anesthesia was obtained.  She was then laid supine on the stretcher.  Foley catheter was placed and then traction boots were placed on both her feet.  She was next placed supine on the HANA fracture table with the perineal post in place and both legs in inline skeletal traction with no traction applied.  Her left operative hip was prepped and draped with DuraPrep and sterile drapes. Time-out was called.  She was identified as correct patient, correct left hip.  We then made an incision inferior and posterior to the anterior superior iliac spine and carried this obliquely down the leg. We dissected down to the tensor fascia lata.  The tensor fascia was divided longitudinally.  We then proceeded with a direct anterior approach to the hip.  We cauterized the lateral femoral circumflex vessels.  We placed Cobra retractors down the medial and lateral neck and then opened the hip capsule and found a large effusion.  We placed Cobra retractors within the hip capsule.  We then made our femoral neck cut with an oscillating saw proximal to the lesser trochanter and completed this with an osteotome.  We placed corkscrew guide  in the femoral head and removed the femoral head in its entirety.  We then placed a bent Hohmann medially and a Cobra retractor laterally.  We cleaned the acetabulum of debris extending the cartilage from the acetabulum.  I then began reaming from a size 42 reamer up to a size 48 reamer in 2 mm increments with all reamers placed under direct visualization and the last reamer under direct fluoroscopy, so we could obtain her depth of reaming, her inclination, and anteversion.  Once we were pleased with this, we placed the real DePuy Sector Gription acetabular component size 48 via apex hole eliminator guide and the real 32+ 4 neutral polyethylene liner for a size 48 acetabular component. Attention was then  turned to the femur with the leg externally rotated to 100 degrees, extended and adducted.  We placed a Hohmann retractor behind the greater trochanter and a Mueller retractor medially.  We used a box cutting osteotome to enter the femoral canal, released the lateral joint capsule and then used a rongeur to lateralize.  I then began broaching from a size 8 broach using the DePuy Corail broaching system all the way up to a size 12.  We placed a calcar planer and then a varus offset neck with a 32+ 1 trial hip ball.  We brought the leg back over and up the traction internal rotation reduced the pelvis and it was stable throughout its arc of rotation with minimal shuck and her leg lengths were measured equal under direct fluoroscopy.  We then dislocated the hip and removed the trial components.  We placed the real Corail femoral component with varus offset size 12 with a real 32+ 1 metal hip ball.  We then brought the leg back up and again reduced in the acetabulum, it was stable.  We copiously irrigated the soft tissues with normal saline solution.  We closed the joint capsule with interrupted #1 Ethibond suture followed by a running #1 Vicryl in the tensor fascia, 0-Vicryl in the deep tissue, 2-0 Vicryl in the subcutaneous tissue, and staples on the skin.  Xeroform and a well- padded sterile dressing was applied.  She was taken off the HANA table and went to the recovery room in stable condition.  All final counts were correct and no complications noted.  Of note, Kriste Basque, PA-C was instrumental in helping this case smoothly.  He was there from patient positioning to exposure to retracting to helping reduce the hip and closure of the wound.     Vanita Panda. Magnus Ivan, M.D.     CYB/MEDQ  D:  07/20/2013  T:  07/21/2013  Job:  [id number]

## 2013-07-22 LAB — CBC
Hemoglobin: 8.8 g/dL — ABNORMAL LOW (ref 12.0–15.0)
MCH: 27.8 pg (ref 26.0–34.0)
MCHC: 32.2 g/dL (ref 30.0–36.0)
RDW: 15.2 % (ref 11.5–15.5)

## 2013-07-22 MED ORDER — TRAMADOL HCL 50 MG PO TABS: 50.0000 mg | ORAL_TABLET | Freq: Four times a day (QID) | ORAL | Status: DC | PRN

## 2013-07-22 NOTE — Progress Notes (Signed)
07/22/13 1700  PT Visit Information  Last PT Received On 07/22/13  Assistance Needed +1  History of Present Illness LTHA direct anterior approach  PT Time Calculation  PT Start Time 1515  PT Stop Time 1546  PT Time Calculation (min) 31 min  Restrictions  Other Position/Activity Restrictions WBAT  Cognition  Arousal/Alertness Awake/alert  Behavior During Therapy WFL for tasks assessed/performed  Overall Cognitive Status Within Functional Limits for tasks assessed  Bed Mobility  Bed Mobility Sit to Supine  Sit to Supine 4: Min assist  Details for Bed Mobility Assistance verbal cues for technique   Transfers  Transfers Sit to Stand;Stand to Sit  Sit to Stand 4: Min guard;From chair/3-in-1;With upper extremity assist  Stand to Sit 4: Min guard;To chair/3-in-1;With upper extremity assist;To bed  Details for Transfer Assistance verbal cues for hand placement and overall safety  Ambulation/Gait  Ambulation/Gait Assistance 4: Min assist;4: Min guard  Ambulation Distance (Feet) 15 Feet (times 2)  Assistive device Rolling walker  Ambulation/Gait Assistance Details cues for sequence and overall safety  Gait Pattern Step-to pattern;Trunk flexed  Total Joint Exercises  Ankle Circles/Pumps AROM;Both;10 reps  Quad Sets AROM;Strengthening;Both;10 reps  Heel Slides AROM;AAROM;10 reps;Left  Hip ABduction/ADduction AAROM;10 reps;Left  PT - End of Session  Equipment Utilized During Treatment Gait belt  Activity Tolerance Patient tolerated treatment well  Patient left in bed;with call bell/phone within reach  PT - Assessment/Plan  PT Plan Current plan remains appropriate  PT Frequency 7X/week  Follow Up Recommendations Home health PT  PT equipment None recommended by PT  PT Goal Progression  Progress towards PT goals Progressing toward goals  Acute Rehab PT Goals  PT Goal Formulation With patient  Potential to Achieve Goals Good  PT General Charges  $$ ACUTE PT VISIT 1 Procedure  PT  Treatments  $Gait Training 8-22 mins  $Therapeutic Exercise 8-22 mins

## 2013-07-22 NOTE — Progress Notes (Signed)
Report received from [facility], RN. No change from initial pm assessment. Will continue to follow the plan of care.

## 2013-07-22 NOTE — Progress Notes (Signed)
Physical Therapy Treatment Patient Details Name: Lauren Rose MRN: [id number] DOB: 1922-01-19 Today's Date: 07/22/2013 Time: 1137-1209 PT Time Calculation (min): 32 min  PT Assessment / Plan / Recommendation  History of Present Illness LTHA direct anterior approach   PT Comments   Pt progressing; States she is depressed today because Dr. Magnus Ivan said she might go home until Wednesday; Discussed this with pt and she reported feeling better about things at end of session; Pain controlled today after meds; Assisted pt to bathroom and with with peri-care (pt supervision/set-up)  Follow Up Recommendations  Home health PT     Does the patient have the potential to tolerate intense rehabilitation     Barriers to Discharge        Equipment Recommendations  None recommended by PT    Recommendations for Other Services    Frequency 7X/week   Progress towards PT Goals Progress towards PT goals: Progressing toward goals  Plan Current plan remains appropriate    Precautions / Restrictions Precautions Precautions: None Restrictions Other Position/Activity Restrictions: WBAT   Pertinent Vitals/Pain     Mobility  Bed Mobility Bed Mobility: Supine to Sit Supine to Sit: Not tested (comment) Transfers Transfers: Sit to Stand;Stand to Sit Sit to Stand: 4: Min assist Stand to Sit: 4: Min assist Details for Transfer Assistance: verbal cues for hand placement and overall safety Ambulation/Gait Ambulation/Gait Assistance: 4: Min assist;4: Min Government social research officer (Feet): 160 Feet (15' more) Assistive device: Rolling walker Ambulation/Gait Assistance Details: verbal cues for sequence and upward gaze Gait Pattern: Step-to pattern;Trunk flexed    Exercises     PT Diagnosis:    PT Problem List:   PT Treatment Interventions:     PT Goals (current goals can now be found in the care plan section) Acute Rehab PT Goals Patient Stated Goal: To go home Time For Goal Achievement:  07/28/13 Potential to Achieve Goals: Good  Visit Information  Last PT Received On: 07/22/13 Assistance Needed: +1 History of Present Illness: LTHA direct anterior approach    Subjective Data  Patient Stated Goal: To go home   Cognition  Cognition Arousal/Alertness: Awake/alert Behavior During Therapy: WFL for tasks assessed/performed Overall Cognitive Status: Within Functional Limits for tasks assessed    Balance     End of Session PT - End of Session Equipment Utilized During Treatment: Gait belt Activity Tolerance: Patient tolerated treatment well Patient left: in chair;with call bell/phone within reach   GP     [facility] 07/22/2013, 2:26 PM

## 2013-07-22 NOTE — Progress Notes (Signed)
   CARE MANAGEMENT NOTE 07/22/2013  Patient:  NATISHA, TRZCINSKI   Account Number:  [id number]  Date Initiated:  07/21/2013  Documentation initiated by:  [facility]  Subjective/Objective Assessment:   LEFT TOTAL HIP ARTHROPLASTY ANTERIOR APPROACH (Left)     Action/Plan:   waiting PT/OT eval   Anticipated DC Date:  07/23/2013   Anticipated DC Plan:  HOME W HOME HEALTH SERVICES      DC Planning Services  CM consult      Choice offered to / List presented to:          [facility] arranged  HH-2 PT      [facility] agency  [facility]   Status of service:  In process, will continue to follow Medicare Important Message given?   (If response is "NO", the following Medicare IM given date fields will be blank) Date Medicare IM given:   Date Additional Medicare IM given:    Discharge Disposition:    Per UR Regulation:    If discussed at Long Length of Stay Meetings, dates discussed:    Comments:

## 2013-07-22 NOTE — Progress Notes (Signed)
Occupational Therapy Treatment Patient Details Name: MATTINGLY FOUNTAINE MRN: [id number] DOB: January 05, 1922 Today's Date: 07/22/2013 Time: 4098-1191 OT Time Calculation (min): 28 min  OT Assessment / Plan / Recommendation  History of present illness LTHA direct anterior approach   OT comments  Pt required more assistance today. C/o pain but hasn't had anything for pain as she is scared it will upset her stomach and make her constipated. Discussed pain control with pt and nsg. REquired Mod A with bed mobility today and mod A for sit - stand from bed. Encouraged better pain control in order to progress with goal of D/C home. Pt does not have 24/7 S. Will continue to follow.  Follow Up Recommendations  Home health OT    Barriers to Discharge       Equipment Recommendations  None recommended by OT    Recommendations for Other Services    Frequency Min 3X/week   Progress towards OT Goals Progress towards OT goals: Progressing toward goals  Plan Discharge plan remains appropriate;Frequency needs to be updated    Precautions / Restrictions Precautions Precautions: None Restrictions Weight Bearing Restrictions: No Other Position/Activity Restrictions: WBAT   Pertinent Vitals/Pain 7 hip. nsg notified. Ice to hip    ADL  Toilet Transfer: Minimal assistance Toilet Transfer Method: Other (comment) (ambulating) Toilet Transfer Equipment: Bedside commode Transfers/Ambulation Related to ADLs: Min A ADL Comments: Has AE at home for LB ADL. Rec pt use LHS    OT Diagnosis:    OT Problem List:   OT Treatment Interventions:     OT Goals(current goals can now be found in the care plan section) Acute Rehab OT Goals Patient Stated Goal: To go home OT Goal Formulation: With patient Time For Goal Achievement: 07/28/13 Potential to Achieve Goals: Good ADL Goals Pt Will Perform Lower Body Bathing: with modified independence;with adaptive equipment;sit to/from stand Pt Will Perform Lower Body  Dressing: with modified independence;with adaptive equipment;sit to/from stand Pt Will Transfer to Toilet: with modified independence;ambulating;bedside commode Pt Will Perform Toileting - Clothing Manipulation and hygiene: with modified independence;sit to/from stand  Visit Information  Last OT Received On: 07/22/13 Assistance Needed: +1 History of Present Illness: LTHA direct anterior approach    Subjective Data      Prior Functioning       Cognition  Cognition Arousal/Alertness: Awake/alert Behavior During Therapy: WFL for tasks assessed/performed Overall Cognitive Status: Within Functional Limits for tasks assessed    Mobility  Bed Mobility Bed Mobility: Supine to Sit;Sitting - Scoot to Edge of Bed Supine to Sit: 3: Mod assist;HOB flat Sitting - Scoot to Edge of Bed: 4: Min assist Details for Bed Mobility Assistance: More assistance required today. Pt unable to move LLE on her own. Attempted to use sheet to assist, but unable Transfers Transfers: Sit to Stand;Stand to Sit Sit to Stand: 4: Min assist Stand to Sit: 4: Min assist Details for Transfer Assistance: verbal cues for hand placement and overall safety    Exercises      Balance     End of Session OT - End of Session Equipment Utilized During Treatment: Rolling walker Activity Tolerance: Patient limited by pain Patient left: in chair;with call bell/phone within reach Nurse Communication: Mobility status;Patient requests pain meds  GO     Kenta Laster,HILLARY 07/22/2013, 9:47 AM [facility], OTR/L  [phone] 07/22/2013

## 2013-07-22 NOTE — Progress Notes (Signed)
Subjective: 2 Days Post-Op Procedure(s) (LRB): LEFT TOTAL HIP ARTHROPLASTY ANTERIOR APPROACH (Left) Patient reports pain as mild.  Had BM.  Working slowly with therapy.  Objective: Vital signs in last 24 hours: Temp:  [98.3 F (36.8 C)-99.1 F (37.3 C)] 98.6 F (37 C) (10/12 0500) Pulse Rate:  [58-75] 72 (10/12 0500) Resp:  [18-20] 20 (10/12 0500) BP: (131-147)/(48-65) 147/48 mmHg (10/12 0500) SpO2:  [93 %-97 %] 95 % (10/12 0500)  Intake/Output from previous day: 10/11 0701 - 10/12 0700 In: 2323.8 [P.O.:1460; I.V.:863.8] Out: 1501 [Urine:1500; Stool:1] Intake/Output this shift:     Recent Labs  07/21/13 0500 07/22/13 0455  HGB 9.4* 8.8*    Recent Labs  07/21/13 0500 07/22/13 0455  WBC 9.1 11.4*  RBC 3.42* 3.16*  HCT 29.3* 27.3*  PLT 252 256    Recent Labs  07/21/13 0500  NA 135  K 4.4  CL 103  CO2 23  BUN 24*  CREATININE 1.20*  GLUCOSE 178*  CALCIUM 8.2*   No results found for this basename: LABPT, INR,  in the last 72 hours  Sensation intact distally Intact pulses distally Dorsiflexion/Plantar flexion intact Incision: dressing C/D/I Compartment soft  Assessment/Plan: 2 Days Post-Op Procedure(s) (LRB): LEFT TOTAL HIP ARTHROPLASTY ANTERIOR APPROACH (Left) Up with therapy Hopefully can discharge to home in the next 1-2 days.  Ninah Moccio Y 07/22/2013, 9:39 AM

## 2013-07-23 ENCOUNTER — Encounter (HOSPITAL_COMMUNITY): Payer: Self-pay | Admitting: Orthopaedic Surgery

## 2013-07-23 LAB — CBC
HCT: 27.9 % — ABNORMAL LOW (ref 36.0–46.0)
Hemoglobin: 9.1 g/dL — ABNORMAL LOW (ref 12.0–15.0)
MCH: 27.9 pg (ref 26.0–34.0)
MCV: 85.6 fL (ref 78.0–100.0)
Platelets: 253 10*3/uL (ref 150–400)
RBC: 3.26 MIL/uL — ABNORMAL LOW (ref 3.87–5.11)
RDW: 15.2 % (ref 11.5–15.5)
WBC: 12.7 10*3/uL — ABNORMAL HIGH (ref 4.0–10.5)

## 2013-07-23 MED ORDER — ASPIRIN 325 MG PO TBEC: 325.0000 mg | DELAYED_RELEASE_TABLET | Freq: Every day | ORAL | Status: DC

## 2013-07-23 MED ORDER — TRAMADOL HCL 50 MG PO TABS: 50.0000 mg | ORAL_TABLET | Freq: Four times a day (QID) | ORAL | Status: DC | PRN

## 2013-07-23 NOTE — Progress Notes (Signed)
Physical Therapy Treatment Patient Details Name: Lauren Rose MRN: [id number] DOB: 1921/11/29 Today's Date: 07/23/2013 Time: 1308-6578 PT Time Calculation (min): 25 min  PT Assessment / Plan / Recommendation  History of Present Illness LTHA direct anterior approach   PT Comments   POD # 3 pm session.  Pt OOB in recliner.  Amb in hallway.  Pt plans to D/C to home tomorrow.   Follow Up Recommendations  Home health PT     Does the patient have the potential to tolerate intense rehabilitation     Barriers to Discharge        Equipment Recommendations  None recommended by PT    Recommendations for Other Services    Frequency 7X/week   Progress towards PT Goals Progress towards PT goals: Progressing toward goals  Plan      Precautions / Restrictions Precautions Precautions: None Restrictions Weight Bearing Restrictions: No Other Position/Activity Restrictions: WBAT    Pertinent Vitals/Pain C/o "stiffness",  "Pain is not bad"    Mobility  Bed Mobility Bed Mobility: Not assessed Details for Bed Mobility Assistance: Pt OOB in recliner Transfers Transfers: Sit to Stand;Stand to Sit Sit to Stand: 4: Min guard;With armrests;From chair/3-in-1 Stand to Sit: 4: Min guard;With armrests;To chair/3-in-1 Details for Transfer Assistance: <25% verbal cues for hand placement and overall safety Ambulation/Gait Ambulation/Gait Assistance: 4: Min guard;5: Supervision Ambulation Distance (Feet): 155 Feet Assistive device: Rolling walker Ambulation/Gait Assistance Details: increased time and <25% VC's on safety with turns using RW Gait Pattern: Step-to pattern;Trunk flexed Gait velocity: decreased    PT Treatment Interventions:     PT Goals (current goals can now be found in the care plan section)    Visit Information  Last PT Received On: 07/23/13 Assistance Needed: +1 History of Present Illness: LTHA direct anterior approach    Subjective Data      Cognition        Balance     End of Session PT - End of Session Equipment Utilized During Treatment: Gait belt Activity Tolerance: Patient tolerated treatment well Patient left: in chair;with call bell/phone within reach   Felecia Shelling  PTA [facility]  Acute  Rehab Pager      [phone]

## 2013-07-23 NOTE — Progress Notes (Signed)
Occupational Therapy Treatment Patient Details Name: Lauren Rose MRN: [id number] DOB: Aug 26, 1922 Today's Date: 07/23/2013 Time: 4098-1191 OT Time Calculation (min): 19 min  OT Assessment / Plan / Recommendation       OT comments  Awake upon arrival and agreeable to participation in skilled O.T., all aspects of toileting with min guard a.  States she has A/E at home for LB self care needs.  Reviewed sponge bathing initially until able to step over tub with [facility] therapy.  Con't. To deny any pain.  Follow Up Recommendations  Home health OT    Barriers to Discharge       Equipment Recommendations  None recommended by OT    Recommendations for Other Services    Frequency Min 3X/week   Progress towards OT Goals Progress towards OT goals: Progressing toward goals  Plan Discharge plan remains appropriate    Precautions / Restrictions Precautions Precautions: None Restrictions Weight Bearing Restrictions: No   Pertinent Vitals/Pain 0/10 per pt.    ADL  Grooming: Simulated;Wash/dry hands;Supervision/safety Where Assessed - Grooming: Supported Copywriter, advertising: Performed;Min Pension scheme manager Method: Sit to Barista: Raised toilet seat with arms (or 3-in-1 over toilet) Where Assessed - Toileting Clothing Manipulation and Hygiene: Standing Transfers/Ambulation Related to ADLs: min guard ADL Comments: has a/e at home for LB ADLS, declined need for review or practice, discussed sponge bathing initially until able to step over tub for tub transfer       OT Goals(current goals can now be found in the care plan section)    Visit Information  Last OT Received On: 07/23/13    Subjective Data   "i do things my way at my own pace, i don't usually start my day until after my soaps at 1:30"          Cognition  Cognition Arousal/Alertness: Awake/alert Behavior During Therapy: WFL for tasks assessed/performed Overall Cognitive Status: Within  Functional Limits for tasks assessed    Mobility  Transfers Transfers: Sit to Stand;Stand to Sit Sit to Stand: 4: Min guard;With armrests;From chair/3-in-1 Stand to Sit: 4: Min guard;With armrests;To chair/3-in-1               End of Session OT - End of Session Equipment Utilized During Treatment: Rolling walker Activity Tolerance: Patient tolerated treatment well Patient left: Other (comment) (P.T. came in to see pt.)       Robet Leu, COTA/L 07/23/2013, 8:48 AM

## 2013-07-23 NOTE — Progress Notes (Signed)
Subjective: 3 Days Post-Op Procedure(s) (LRB): LEFT TOTAL HIP ARTHROPLASTY ANTERIOR APPROACH (Left) Patient reports pain as mild.  Making good progress with therapy.  Not confident to go home today.  Hopes for another good day of therapy and discharge tomorrow.  Her hgb is stable, but her WBC did go up.  Objective: Vital signs in last 24 hours: Temp:  [98.2 F (36.8 C)-98.4 F (36.9 C)] 98.2 F (36.8 C) (10/13 0451) Pulse Rate:  [61-65] 65 (10/13 0451) Resp:  [16-18] 18 (10/13 0451) BP: (121-152)/(66-70) 148/66 mmHg (10/13 0451) SpO2:  [95 %-96 %] 96 % (10/13 0451)  Intake/Output from previous day: 10/12 0701 - 10/13 0700 In: 360 [P.O.:360] Out: -  Intake/Output this shift:     Recent Labs  07/21/13 0500 07/22/13 0455 07/23/13 0450  HGB 9.4* 8.8* 9.1*    Recent Labs  07/22/13 0455 07/23/13 0450  WBC 11.4* 12.7*  RBC 3.16* 3.26*  HCT 27.3* 27.9*  PLT 256 253    Recent Labs  07/21/13 0500  NA 135  K 4.4  CL 103  CO2 23  BUN 24*  CREATININE 1.20*  GLUCOSE 178*  CALCIUM 8.2*   No results found for this basename: LABPT, INR,  in the last 72 hours  Sensation intact distally Intact pulses distally Dorsiflexion/Plantar flexion intact Incision: dressing C/D/I Compartment soft  Assessment/Plan: 3 Days Post-Op Procedure(s) (LRB): LEFT TOTAL HIP ARTHROPLASTY ANTERIOR APPROACH (Left) Plan for discharge tomorrow Re-check CBC in am 10/14  Shyloh Krinke Y 07/23/2013, 7:38 AM

## 2013-07-23 NOTE — Progress Notes (Signed)
Physical Therapy Treatment Patient Details Name: Lauren Rose MRN: [id number] DOB: December 27, 1921 Today's Date: 07/23/2013 Time:  -     PT Assessment / Plan / Recommendation  History of Present Illness     PT Comments   Pt progressing, will likely D/c home tomorrow;   Follow Up Recommendations  Home health PT     Does the patient have the potential to tolerate intense rehabilitation     Barriers to Discharge        Equipment Recommendations  None recommended by PT    Recommendations for Other Services    Frequency 7X/week   Progress towards PT Goals Progress towards PT goals: Progressing toward goals  Plan Current plan remains appropriate    Precautions / Restrictions Precautions Precautions: None Restrictions Weight Bearing Restrictions: No   Pertinent Vitals/Pain Denies pain    Mobility  Transfers Sit to Stand: 4: Min guard;With armrests;From chair/3-in-1 Stand to Sit: 4: Min guard;With armrests;To chair/3-in-1 Details for Transfer Assistance: verbal cues for hand placement and overall safety Ambulation/Gait Ambulation/Gait Assistance: 4: Min guard;5: Supervision Ambulation Distance (Feet): 160 Feet Assistive device: Rolling walker Gait Pattern: Step-to pattern;Trunk flexed    Exercises Total Joint Exercises Ankle Circles/Pumps: AROM;Both;15 reps Quad Sets: AROM;Strengthening;Both;10 reps Heel Slides: AROM;AAROM;10 reps;Left   PT Diagnosis:    PT Problem List:   PT Treatment Interventions:     PT Goals (current goals can now be found in the care plan section) Acute Rehab PT Goals PT Goal Formulation: With patient Time For Goal Achievement: 07/28/13 Potential to Achieve Goals: Good  Visit Information       Subjective Data      Cognition  Cognition Arousal/Alertness: Awake/alert Behavior During Therapy: [facility] for tasks assessed/performed Overall Cognitive Status: Within Functional Limits for tasks assessed    Balance     End of Session PT - End of  Session Activity Tolerance: Patient tolerated treatment well Patient left: in chair;with call bell/phone within reach Nurse Communication: Mobility status   GP     [facility] 07/23/2013, 10:44 AM

## 2013-07-24 LAB — CBC
HCT: 27 % — ABNORMAL LOW (ref 36.0–46.0)
Hemoglobin: 8.8 g/dL — ABNORMAL LOW (ref 12.0–15.0)
MCH: 28.1 pg (ref 26.0–34.0)
MCHC: 32.6 g/dL (ref 30.0–36.0)
MCV: 86.3 fL (ref 78.0–100.0)
Platelets: 260 10*3/uL (ref 150–400)
RDW: 15.4 % (ref 11.5–15.5)
WBC: 11.8 10*3/uL — ABNORMAL HIGH (ref 4.0–10.5)

## 2013-07-24 NOTE — Progress Notes (Signed)
Physical Therapy Treatment Patient Details Name: SHAMECCA WHITEBREAD MRN: [id number] DOB: 1922/07/04 Today's Date: 07/24/2013 Time: 1012-1036 PT Time Calculation (min): 24 min  PT Assessment / Plan / Recommendation  History of Present Illness LTHA direct anterior approach   PT Comments   Assisted pt OOB to amb to BR then in hallway.  Pt required increased time and <25% VC's on walker safety.  Static standing at sink x 3 min to brush teeth. Positioned in recliner with ICE.  Pr progressing well and plans to D/C to home later today.    Follow Up Recommendations  Home health PT     Does the patient have the potential to tolerate intense rehabilitation     Barriers to Discharge        Equipment Recommendations       Recommendations for Other Services    Frequency 7X/week   Progress towards PT Goals Progress towards PT goals: Progressing toward goals  Plan      Precautions / Restrictions Precautions Precautions: None Restrictions Weight Bearing Restrictions: No Other Position/Activity Restrictions: WBAT    Pertinent Vitals/Pain C/o "soreness"    Mobility  Bed Mobility Bed Mobility: Supine to Sit Supine to Sit: 4: Min guard Details for Bed Mobility Assistance: Min Guard assist for L LE off bed plus increased time Transfers Transfers: Sit to Stand;Stand to Sit Sit to Stand: 5: Supervision;From bed;From toilet Stand to Sit: 5: Supervision;To toilet;To chair/3-in-1 Details for Transfer Assistance: <25% verbal cues for hand placement and overall safety Ambulation/Gait Ambulation/Gait Assistance: 4: Min guard;5: Supervision Ambulation Distance (Feet): 170 Feet Assistive device: Rolling walker Ambulation/Gait Assistance Details: increased time and >25% VC's safety with turns with RW and backward gait Gait Pattern: Step-to pattern;Trunk flexed Gait velocity: decreased    Exercises  15 reps B AP 15 reps B knee presses    PT Goals (current goals can now be found in the care plan  section)    Visit Information  Last PT Received On: 07/24/13 Assistance Needed: +1 History of Present Illness: LTHA direct anterior approach    Subjective Data      Cognition       Balance     End of Session PT - End of Session Equipment Utilized During Treatment: Gait belt Activity Tolerance: Patient tolerated treatment well Patient left: in chair;with call bell/phone within reach   Felecia Shelling  PTA [facility]  Acute  Rehab Pager      [phone]

## 2013-07-24 NOTE — Care Management Note (Signed)
    Page 1 of 1   07/24/2013     1:27:31 PM   CARE MANAGEMENT NOTE 07/24/2013  Patient:  Lauren Rose, Lauren Rose   Account Number:  [id number]  Date Initiated:  07/21/2013  Documentation initiated by:  [facility]  Subjective/Objective Assessment:   LEFT TOTAL HIP ARTHROPLASTY ANTERIOR APPROACH (Left)     Action/Plan:   waiting PT/OT eval   Anticipated DC Date:  07/23/2013   Anticipated DC Plan:  HOME W HOME HEALTH SERVICES      DC Planning Services  CM consult      [facility] Choice  HOME HEALTH   Choice offered to / List presented to:  C-1 Patient        HH arranged  HH-2 PT      [facility] agency  [facility]   Status of service:  Completed, signed off Medicare Important Message given?   (If response is "NO", the following Medicare IM given date fields will be blank) Date Medicare IM given:   Date Additional Medicare IM given:    Discharge Disposition:    Per UR Regulation:    If discussed at Long Length of Stay Meetings, dates discussed:    Comments:  07/24/2013 Colleen Can BSN RN CCM [phone] Pt plans to return to her home in [place] where her 2 sisters will be caregivers. She already has DME-RW, commode seat. States Genevieve Norlander will provide [facility] services. Plans are for discharge toda. HH services will start tomorrow 07/25/2013.

## 2013-07-24 NOTE — Discharge Summary (Signed)
Patient ID: ZERIYAH Rose MRN: [id number] DOB/AGE: [birth year] 79 y.o.  Admit date: 07/20/2013 Discharge date: 07/24/2013  Admission Diagnoses:  Principal Problem:   Degenerative arthritis of hip, left   Discharge Diagnoses:  S/P left hip arthroplasty direct anterior approach Acute Blood loss amenia post-op asymtomatic  Past Medical History  Diagnosis Date  . Colon polyps   . PONV (postoperative nausea and vomiting)   . Hypertension   . Hyperlipidemia   . 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 Cyndra Numbers every 6 months x 2 yrs  . Arthritis   . History of gout   . History of skin cancer   . Rash     yeast infection under breasts  . GERD (gastroesophageal reflux disease)   . Kidney cysts     followed by Dr. Retta Diones  . Frequency   . Nocturia   . Anemia   . Diabetes mellitus without complication   . Hypothyroidism   . Depression   . Cancer     hx of thyroid cancer    Surgeries: Procedure(s): LEFT TOTAL HIP ARTHROPLASTY ANTERIOR APPROACH on 07/20/2013   Consultants:  PT  Discharged Condition: Improved  Hospital Course: Lauren Rose is an 79 y.o. female who was admitted 07/20/2013 for operative treatment ofDegenerative arthritis of hip. Patient has severe unremitting pain that affects sleep, daily activities, and work/hobbies. After pre-op clearance the patient was taken to the operating room on 07/20/2013 and underwent  Procedure(s): LEFT TOTAL HIP ARTHROPLASTY ANTERIOR APPROACH.    Patient was given perioperative antibiotics: Anti-infectives   Start     Dose/Rate Route Frequency Ordered Stop   07/20/13 2000  ceFAZolin (ANCEF) IVPB 1 g/50 mL premix     1 g 100 mL/hr over 30 Minutes Intravenous Every 6 hours 07/20/13 1730 07/21/13 0623   07/20/13 1930  terbinafine (LAMISIL) tablet 250 mg     250 mg Oral Daily 07/20/13 1730 07/30/13 2359   07/20/13 1009  ceFAZolin (ANCEF) IVPB 2 g/50 mL premix     2 g 100 mL/hr over 30 Minutes Intravenous On  call to O.R. 07/20/13 1009 07/20/13 1403       Patient was given sequential compression devices, early ambulation, and chemoprophylaxis to prevent DVT.  Patient benefited maximally from hospital stay and there were no complications.    Recent vital signs: Patient Vitals for the past 24 hrs:  BP Temp Temp src Pulse Resp SpO2  07/24/13 0600 145/64 mmHg 98.5 F (36.9 C) Oral 60 14 93 %  07/24/13 0200 145/65 mmHg 98.5 F (36.9 C) Oral 66 14 95 %  07/23/13 2212 141/71 mmHg - - 66 - -  07/23/13 2132 144/71 mmHg 97.6 F (36.4 C) Oral 66 16 97 %  07/23/13 1355 131/67 mmHg 97.8 F (36.6 C) Oral 67 16 96 %  07/23/13 0933 148/67 mmHg - - 70 - -     Recent laboratory studies:  Recent Labs  07/23/13 0450 07/24/13 0410  WBC 12.7* 11.8*  HGB 9.1* 8.8*  HCT 27.9* 27.0*  PLT 253 260     Discharge Medications:     Medication List         amLODipine 10 MG tablet  Commonly known as:  NORVASC  Take 10 mg by mouth every morning.     aspirin 325 MG EC tablet  Take 1 tablet (325 mg total) by mouth daily with breakfast.     atenolol 25 MG tablet  Commonly  known as:  TENORMIN  Take 12.5 mg by mouth 2 (two) times daily.     calcium-vitamin D 500-200 MG-UNIT per tablet  Commonly known as:  OSCAL WITH D  Take 1 tablet by mouth daily with breakfast.     diclofenac sodium 1 % Gel  Commonly known as:  VOLTAREN  Apply 2 g topically 4 (four) times daily.     estradiol 0.1 MG/GM vaginal cream  Commonly known as:  ESTRACE  Place 2 g vaginally 2 (two) times a week.     etodolac 200 MG capsule  Commonly known as:  LODINE  Take 200 mg by mouth every morning.     hydrochlorothiazide 25 MG tablet  Commonly known as:  HYDRODIURIL  Take 25 mg by mouth every morning.     levothyroxine 125 MCG tablet  Commonly known as:  SYNTHROID, LEVOTHROID  Take 125 mcg by mouth daily before breakfast.     losartan 100 MG tablet  Commonly known as:  COZAAR  Take 100 mg by mouth every morning.      multivitamin with minerals Tabs tablet  Take 1 tablet by mouth daily.     omeprazole 20 MG capsule  Commonly known as:  PRILOSEC  Take 20 mg by mouth daily.     PARoxetine 10 MG tablet  Commonly known as:  PAXIL  Take 10 mg by mouth every morning.     simvastatin 10 MG tablet  Commonly known as:  ZOCOR  Take 10 mg by mouth every evening.     sitaGLIPtin 50 MG tablet  Commonly known as:  JANUVIA  Take 50 mg by mouth every morning.     terbinafine 250 MG tablet  Commonly known as:  LAMISIL  Take 250 mg by mouth daily. X 14 days starting 07/16/13     traMADol 50 MG tablet  Commonly known as:  ULTRAM  Take 1 tablet (50 mg total) by mouth every 6 (six) hours as needed.     Vitamin D 2000 UNITS tablet  Take 2,000 Units by mouth daily.        Diagnostic Studies: Dg Chest 2 View  07/12/2013   CLINICAL DATA:  Preop respiratory exam for left hip arthroplasty. Hypertension. Diabetes. Hypothyroidism. Thyroid carcinoma.  EXAM: CHEST  2 VIEW  COMPARISON:  None.  FINDINGS: Heart size is within normal limits. Mild hyperinflation again noted, suspicious for COPD. No evidence of pulmonary infiltrate or edema. No evidence of pleural effusion. No mass or lymphadenopathy identified.  IMPRESSION: Stable exam. Probable COPD. No active disease.   Electronically Signed   By: Myles Rosenthal   On: 07/12/2013 16:14   Dg Hip Complete Left  07/20/2013   CLINICAL DATA:  Left hip replacement  EXAM: LEFT HIP - COMPLETE 2+ VIEW  COMPARISON:  November 09, 2012  FINDINGS: Two fluoroscopic images from left hip replacement are submitted. A left hip replacement is identified with no malalignment.  IMPRESSION: Left hip replacement identified with no malalignment.   Electronically Signed   By: Sherian Rein M.D.   On: 07/20/2013 16:40   Dg Pelvis Portable  07/20/2013   CLINICAL DATA:  Status post left hip surgery.  EXAM: PORTABLE PELVIS  COMPARISON:  07/20/2013.  FINDINGS: Postoperative changes of left hip total  arthroplasty are noted. The femoral and acetabular components of the prosthesis appear properly seated without definite periprosthetic fracture or other immediate complicating features. There is extensive gas within the joint space and the surrounding soft tissues. Skin staples  are noted lateral to the prosthesis. Markers from a matched are noted projecting over the lower abdomen, likely from prior ventral hernia repair. Extensive atherosclerotic calcifications are noted.  IMPRESSION: Postoperative changes related to left hip total arthroplasty, as above.   Electronically Signed   By: Trudie Reed M.D.   On: 07/20/2013 19:09   Dg Hip Portable 1 View Left  07/20/2013   CLINICAL DATA:  Left hip total arthroplasty.  EXAM: PORTABLE LEFT HIP - 1 VIEW  COMPARISON:  05/29/2013.  FINDINGS: Single cross-table lateral view of the left hip demonstrates postoperative changes of left hip arthroplasty. The femoral and acetabular components appear properly located, without definite periprosthetic fracture or other immediate complicating features. Gas is present in the overlying soft tissues, and overlying skin staples are noted.  IMPRESSION: 1. Status post left hip hemiarthroplasty. The femoral head appears located on this lateral view.   Electronically Signed   By: Trudie Reed M.D.   On: 07/20/2013 19:22   Dg C-arm 1-60 Min-no Report  07/20/2013   CLINICAL DATA: left anterior hip   C-ARM 1-60 MINUTES  Fluoroscopy was utilized by the requesting physician.  No radiographic  interpretation.     Disposition:       Discharge Orders   Future Orders Complete By Expires   Discharge wound care:  As directed    Comments:     Keep dressing clean and intact. May shower with dressing intact . Remove dressing Thursday and shower. After showering apply clean dressing.   Weight bearing as tolerated  As directed    Questions:     Laterality:     Extremity:        Follow-up Information   Follow up with [facility] (Home Health Physical Therapy)    Contact information:   [phone]       Signed: Richardean Canal 07/24/2013, 8:15 AM

## 2013-07-24 NOTE — Progress Notes (Signed)
Subjective: 4 Days Post-Op Procedure(s) (LRB): LEFT TOTAL HIP ARTHROPLASTY ANTERIOR APPROACH (Left) Patient reports pain as mild.  No complaints.  Objective: Vital signs in last 24 hours: Temp:  [97.6 F (36.4 C)-98.5 F (36.9 C)] 98.5 F (36.9 C) (10/14 0600) Pulse Rate:  [60-70] 60 (10/14 0600) Resp:  [14-16] 14 (10/14 0600) BP: (131-148)/(64-71) 145/64 mmHg (10/14 0600) SpO2:  [93 %-97 %] 93 % (10/14 0600)  Intake/Output from previous day: 10/13 0701 - 10/14 0700 In: 720 [P.O.:720] Out: 300 [Urine:300] Intake/Output this shift:     Recent Labs  07/22/13 0455 07/23/13 0450 07/24/13 0410  HGB 8.8* 9.1* 8.8*    Recent Labs  07/23/13 0450 07/24/13 0410  WBC 12.7* 11.8*  RBC 3.26* 3.13*  HCT 27.9* 27.0*  PLT 253 260   No results found for this basename: NA, K, CL, CO2, BUN, CREATININE, GLUCOSE, CALCIUM,  in the last 72 hours No results found for this basename: LABPT, INR,  in the last 72 hours Left lower extremity: Neurovascular intact Sensation intact distally Intact pulses distally Dorsiflexion/Plantar flexion intact Incision: dressing C/D/I Compartment soft  Assessment/Plan: 4 Days Post-Op Procedure(s) (LRB): LEFT TOTAL HIP ARTHROPLASTY ANTERIOR APPROACH (Left) Discharge home with home health  Richardean Canal 07/24/2013, 8:05 AM

## 2013-08-06 NOTE — Anesthesia Postprocedure Evaluation (Signed)
  Anesthesia Post-op Note  Patient: Lauren Rose  Procedure(s) Performed: Procedure(s) (LRB): LEFT TOTAL HIP ARTHROPLASTY ANTERIOR APPROACH (Left)  Patient Location: PACU  Anesthesia Type: Spinal  Level of Consciousness: awake and alert   Airway and Oxygen Therapy: Patient Spontanous Breathing  Post-op Pain: mild  Post-op Assessment: Post-op Vital signs reviewed, Patient's Cardiovascular Status Stable, Respiratory Function Stable, Patent Airway and No signs of Nausea or vomiting  Last Vitals:  Filed Vitals:   07/24/13 1006  BP: 152/75  Pulse: 67  Temp: 36.8 C  Resp: 18    Post-op Vital Signs: stable   Complications: No apparent anesthesia complications

## 2013-08-22 ENCOUNTER — Other Ambulatory Visit: Payer: Self-pay | Admitting: Dermatology

## 2013-09-24 ENCOUNTER — Other Ambulatory Visit: Payer: Self-pay | Admitting: Gastroenterology

## 2013-09-24 DIAGNOSIS — R109 Unspecified abdominal pain: Secondary | ICD-10-CM

## 2013-09-25 ENCOUNTER — Ambulatory Visit
Admission: RE | Admit: 2013-09-25 | Discharge: 2013-09-25 | Disposition: A | Discharge status: Home / Self Care | Payer: Medicare Other | Source: Ambulatory Visit | Attending: Gastroenterology | Admitting: Gastroenterology

## 2013-09-25 DIAGNOSIS — R109 Unspecified abdominal pain: Secondary | ICD-10-CM

## 2013-09-25 MED ORDER — IOHEXOL 300 MG/ML  SOLN
75.0000 mL | Freq: Once | INTRAMUSCULAR | Status: AC | PRN
  Administered 2013-09-25: 75 mL via INTRAVENOUS

## 2013-09-26 ENCOUNTER — Other Ambulatory Visit: Payer: Self-pay | Admitting: Gastroenterology

## 2013-09-26 DIAGNOSIS — R1011 Right upper quadrant pain: Secondary | ICD-10-CM

## 2013-10-08 ENCOUNTER — Other Ambulatory Visit: Payer: Medicare Other

## 2013-10-08 ENCOUNTER — Ambulatory Visit
Admission: RE | Admit: 2013-10-08 | Discharge: 2013-10-08 | Disposition: A | Discharge status: Home / Self Care | Payer: Medicare Other | Source: Ambulatory Visit | Attending: Gastroenterology | Admitting: Gastroenterology

## 2013-10-08 DIAGNOSIS — R1011 Right upper quadrant pain: Secondary | ICD-10-CM

## 2013-11-06 ENCOUNTER — Encounter: Payer: Medicare Other | Admitting: Vascular Surgery

## 2014-01-10 ENCOUNTER — Other Ambulatory Visit: Payer: Self-pay

## 2014-01-10 DIAGNOSIS — Z1231 Encounter for screening mammogram for malignant neoplasm of breast: Secondary | ICD-10-CM

## 2014-01-29 ENCOUNTER — Ambulatory Visit
Admission: RE | Admit: 2014-01-29 | Discharge: 2014-01-29 | Disposition: A | Discharge status: Home / Self Care | Payer: Medicare Other | Source: Ambulatory Visit

## 2014-01-29 ENCOUNTER — Encounter (INDEPENDENT_AMBULATORY_CARE_PROVIDER_SITE_OTHER): Payer: Self-pay

## 2014-01-29 DIAGNOSIS — Z1231 Encounter for screening mammogram for malignant neoplasm of breast: Secondary | ICD-10-CM

## 2015-02-24 ENCOUNTER — Other Ambulatory Visit: Payer: Self-pay

## 2015-02-24 DIAGNOSIS — Z1231 Encounter for screening mammogram for malignant neoplasm of breast: Secondary | ICD-10-CM

## 2015-03-04 ENCOUNTER — Ambulatory Visit
Admission: RE | Admit: 2015-03-04 | Discharge: 2015-03-04 | Disposition: A | Discharge status: Home / Self Care | Payer: Medicare Other | Source: Ambulatory Visit

## 2015-03-04 DIAGNOSIS — Z1231 Encounter for screening mammogram for malignant neoplasm of breast: Secondary | ICD-10-CM

## 2015-03-05 ENCOUNTER — Other Ambulatory Visit: Payer: Self-pay | Admitting: Family Medicine

## 2015-03-05 DIAGNOSIS — R928 Other abnormal and inconclusive findings on diagnostic imaging of breast: Secondary | ICD-10-CM

## 2015-03-11 ENCOUNTER — Ambulatory Visit
Admission: RE | Admit: 2015-03-11 | Discharge: 2015-03-11 | Disposition: A | Discharge status: Home / Self Care | Payer: Medicare Other | Source: Ambulatory Visit | Attending: Family Medicine | Admitting: Family Medicine

## 2015-03-11 DIAGNOSIS — R928 Other abnormal and inconclusive findings on diagnostic imaging of breast: Secondary | ICD-10-CM

## 2016-02-17 ENCOUNTER — Other Ambulatory Visit: Payer: Self-pay

## 2016-02-17 DIAGNOSIS — Z1231 Encounter for screening mammogram for malignant neoplasm of breast: Secondary | ICD-10-CM

## 2016-11-01 ENCOUNTER — Other Ambulatory Visit: Payer: Self-pay | Admitting: Family Medicine

## 2016-11-01 ENCOUNTER — Ambulatory Visit
Admission: RE | Admit: 2016-11-01 | Discharge: 2016-11-01 | Disposition: A | Discharge status: Home / Self Care | Payer: Medicare Other | Source: Ambulatory Visit | Attending: Family Medicine | Admitting: Family Medicine

## 2016-11-01 DIAGNOSIS — M25551 Pain in right hip: Secondary | ICD-10-CM

## 2018-01-06 ENCOUNTER — Other Ambulatory Visit: Payer: Self-pay | Admitting: Family Medicine

## 2018-01-06 ENCOUNTER — Ambulatory Visit
Admission: RE | Admit: 2018-01-06 | Discharge: 2018-01-06 | Disposition: A | Discharge status: Home / Self Care | Payer: Medicare Other | Source: Ambulatory Visit | Attending: Family Medicine | Admitting: Family Medicine

## 2018-01-06 DIAGNOSIS — R0600 Dyspnea, unspecified: Secondary | ICD-10-CM

## 2019-01-04 ENCOUNTER — Emergency Department (HOSPITAL_COMMUNITY): Payer: Medicare Other

## 2019-01-04 ENCOUNTER — Other Ambulatory Visit: Payer: Self-pay

## 2019-01-04 ENCOUNTER — Encounter (HOSPITAL_COMMUNITY): Payer: Self-pay | Admitting: Emergency Medicine

## 2019-01-04 ENCOUNTER — Inpatient Hospital Stay (HOSPITAL_COMMUNITY)
Admission: EM | Admit: 2019-01-04 | Discharge: 2019-01-08 | DRG: 064 | Disposition: A | Discharge status: Skilled Nursing Facility | Payer: Medicare Other | Attending: Internal Medicine | Admitting: Internal Medicine

## 2019-01-04 DIAGNOSIS — M109 Gout, unspecified: Secondary | ICD-10-CM | POA: Diagnosis present

## 2019-01-04 DIAGNOSIS — Z85828 Personal history of other malignant neoplasm of skin: Secondary | ICD-10-CM

## 2019-01-04 DIAGNOSIS — Z7984 Long term (current) use of oral hypoglycemic drugs: Secondary | ICD-10-CM

## 2019-01-04 DIAGNOSIS — Z66 Do not resuscitate: Secondary | ICD-10-CM | POA: Diagnosis present

## 2019-01-04 DIAGNOSIS — I619 Nontraumatic intracerebral hemorrhage, unspecified: Secondary | ICD-10-CM | POA: Diagnosis present

## 2019-01-04 DIAGNOSIS — I10 Essential (primary) hypertension: Secondary | ICD-10-CM | POA: Diagnosis present

## 2019-01-04 DIAGNOSIS — D649 Anemia, unspecified: Secondary | ICD-10-CM | POA: Diagnosis present

## 2019-01-04 DIAGNOSIS — K219 Gastro-esophageal reflux disease without esophagitis: Secondary | ICD-10-CM | POA: Diagnosis present

## 2019-01-04 DIAGNOSIS — Z85850 Personal history of malignant neoplasm of thyroid: Secondary | ICD-10-CM

## 2019-01-04 DIAGNOSIS — R471 Dysarthria and anarthria: Secondary | ICD-10-CM | POA: Diagnosis present

## 2019-01-04 DIAGNOSIS — I21A1 Myocardial infarction type 2: Secondary | ICD-10-CM | POA: Diagnosis present

## 2019-01-04 DIAGNOSIS — Z96642 Presence of left artificial hip joint: Secondary | ICD-10-CM | POA: Diagnosis present

## 2019-01-04 DIAGNOSIS — Z79890 Hormone replacement therapy (postmenopausal): Secondary | ICD-10-CM | POA: Diagnosis not present

## 2019-01-04 DIAGNOSIS — E890 Postprocedural hypothyroidism: Secondary | ICD-10-CM | POA: Diagnosis present

## 2019-01-04 DIAGNOSIS — K5900 Constipation, unspecified: Secondary | ICD-10-CM | POA: Diagnosis present

## 2019-01-04 DIAGNOSIS — W1830XA Fall on same level, unspecified, initial encounter: Secondary | ICD-10-CM | POA: Diagnosis present

## 2019-01-04 DIAGNOSIS — R1111 Vomiting without nausea: Secondary | ICD-10-CM

## 2019-01-04 DIAGNOSIS — F329 Major depressive disorder, single episode, unspecified: Secondary | ICD-10-CM | POA: Diagnosis present

## 2019-01-04 DIAGNOSIS — R1110 Vomiting, unspecified: Secondary | ICD-10-CM | POA: Diagnosis present

## 2019-01-04 DIAGNOSIS — E119 Type 2 diabetes mellitus without complications: Secondary | ICD-10-CM | POA: Diagnosis present

## 2019-01-04 DIAGNOSIS — Z7401 Bed confinement status: Secondary | ICD-10-CM

## 2019-01-04 DIAGNOSIS — Z7982 Long term (current) use of aspirin: Secondary | ICD-10-CM | POA: Diagnosis not present

## 2019-01-04 DIAGNOSIS — M6282 Rhabdomyolysis: Secondary | ICD-10-CM | POA: Diagnosis present

## 2019-01-04 DIAGNOSIS — T796XXA Traumatic ischemia of muscle, initial encounter: Secondary | ICD-10-CM | POA: Diagnosis present

## 2019-01-04 DIAGNOSIS — Z79891 Long term (current) use of opiate analgesic: Secondary | ICD-10-CM | POA: Diagnosis not present

## 2019-01-04 DIAGNOSIS — Y92003 Bedroom of unspecified non-institutional (private) residence as the place of occurrence of the external cause: Secondary | ICD-10-CM

## 2019-01-04 DIAGNOSIS — E039 Hypothyroidism, unspecified: Secondary | ICD-10-CM | POA: Diagnosis not present

## 2019-01-04 DIAGNOSIS — I639 Cerebral infarction, unspecified: Secondary | ICD-10-CM

## 2019-01-04 DIAGNOSIS — E785 Hyperlipidemia, unspecified: Secondary | ICD-10-CM | POA: Diagnosis present

## 2019-01-04 DIAGNOSIS — R296 Repeated falls: Secondary | ICD-10-CM | POA: Diagnosis not present

## 2019-01-04 DIAGNOSIS — I6340 Cerebral infarction due to embolism of unspecified cerebral artery: Secondary | ICD-10-CM | POA: Diagnosis present

## 2019-01-04 DIAGNOSIS — Z8719 Personal history of other diseases of the digestive system: Secondary | ICD-10-CM | POA: Diagnosis not present

## 2019-01-04 DIAGNOSIS — R7989 Other specified abnormal findings of blood chemistry: Secondary | ICD-10-CM

## 2019-01-04 DIAGNOSIS — Z79899 Other long term (current) drug therapy: Secondary | ICD-10-CM

## 2019-01-04 DIAGNOSIS — R778 Other specified abnormalities of plasma proteins: Secondary | ICD-10-CM

## 2019-01-04 LAB — URINALYSIS, ROUTINE W REFLEX MICROSCOPIC
Bilirubin Urine: NEGATIVE
Glucose, UA: NEGATIVE mg/dL
Ketones, ur: NEGATIVE mg/dL
Leukocytes,Ua: NEGATIVE
Nitrite: NEGATIVE
Protein, ur: 100 mg/dL — AB
Specific Gravity, Urine: 1.013 (ref 1.005–1.030)
pH: 6 (ref 5.0–8.0)

## 2019-01-04 LAB — CBC WITH DIFFERENTIAL/PLATELET
Abs Immature Granulocytes: 0.1 10*3/uL — ABNORMAL HIGH (ref 0.00–0.07)
Basophils Absolute: 0 10*3/uL (ref 0.0–0.1)
Basophils Relative: 0 %
Eosinophils Absolute: 0.1 10*3/uL (ref 0.0–0.5)
Eosinophils Relative: 1 %
HEMATOCRIT: 39.8 % (ref 36.0–46.0)
Hemoglobin: 12.6 g/dL (ref 12.0–15.0)
Immature Granulocytes: 1 %
LYMPHS ABS: 0.6 10*3/uL — AB (ref 0.7–4.0)
Lymphocytes Relative: 3 %
MCH: 27.8 pg (ref 26.0–34.0)
MCHC: 31.7 g/dL (ref 30.0–36.0)
MCV: 87.9 fL (ref 80.0–100.0)
Monocytes Absolute: 1 10*3/uL (ref 0.1–1.0)
Monocytes Relative: 5 %
Neutro Abs: 17.1 10*3/uL — ABNORMAL HIGH (ref 1.7–7.7)
Neutrophils Relative %: 90 %
Platelets: 319 10*3/uL (ref 150–400)
RBC: 4.53 MIL/uL (ref 3.87–5.11)
RDW: 14.5 % (ref 11.5–15.5)
WBC: 19 10*3/uL — ABNORMAL HIGH (ref 4.0–10.5)
nRBC: 0 % (ref 0.0–0.2)

## 2019-01-04 LAB — COMPREHENSIVE METABOLIC PANEL
ALBUMIN: 3.5 g/dL (ref 3.5–5.0)
ALT: 24 U/L (ref 0–44)
AST: 42 U/L — ABNORMAL HIGH (ref 15–41)
Alkaline Phosphatase: 57 U/L (ref 38–126)
Anion gap: 12 (ref 5–15)
BILIRUBIN TOTAL: 0.7 mg/dL (ref 0.3–1.2)
BUN: 18 mg/dL (ref 8–23)
CO2: 23 mmol/L (ref 22–32)
Calcium: 9.6 mg/dL (ref 8.9–10.3)
Chloride: 100 mmol/L (ref 98–111)
Creatinine, Ser: 0.93 mg/dL (ref 0.44–1.00)
GFR calc Af Amer: >60 mL/min (ref >60)
GFR calc non Af Amer: 53 mL/min — ABNORMAL LOW (ref >60)
GLUCOSE: 182 mg/dL — AB (ref 70–99)
Potassium: 4.1 mmol/L (ref 3.5–5.1)
Sodium: 135 mmol/L (ref 135–145)
Total Protein: 7.4 g/dL (ref 6.5–8.1)

## 2019-01-04 LAB — ACETAMINOPHEN LEVEL

## 2019-01-04 LAB — GRAM STAIN

## 2019-01-04 LAB — TSH: TSH: 0.16 u[IU]/mL — ABNORMAL LOW (ref 0.350–4.500)

## 2019-01-04 LAB — CK: Total CK: 580 U/L — ABNORMAL HIGH (ref 38–234)

## 2019-01-04 LAB — TROPONIN I
Troponin I: 0.06 ng/mL (ref <0.03)
Troponin I: 0.07 ng/mL (ref <0.03)

## 2019-01-04 LAB — MAGNESIUM: Magnesium: 1.7 mg/dL (ref 1.7–2.4)

## 2019-01-04 LAB — LIPASE, BLOOD: Lipase: 23 U/L (ref 11–51)

## 2019-01-04 LAB — SALICYLATE LEVEL: Salicylate Lvl: <7 mg/dL (ref 2.8–30.0)

## 2019-01-04 LAB — CBG MONITORING, ED: Glucose-Capillary: 169 mg/dL — ABNORMAL HIGH (ref 70–99)

## 2019-01-04 LAB — AMMONIA: Ammonia: 12 umol/L (ref 9–35)

## 2019-01-04 MED ORDER — ENOXAPARIN SODIUM 40 MG/0.4ML ~~LOC~~ SOLN: 40.0000 mg | Freq: Every day | SUBCUTANEOUS | Status: DC

## 2019-01-04 MED ORDER — ONDANSETRON HCL 4 MG/2ML IJ SOLN: 4.0000 mg | Freq: Four times a day (QID) | INTRAMUSCULAR | Status: DC | PRN

## 2019-01-04 MED ORDER — ACETAMINOPHEN 650 MG RE SUPP: 650.0000 mg | Freq: Four times a day (QID) | RECTAL | Status: DC | PRN

## 2019-01-04 MED ORDER — PANTOPRAZOLE SODIUM 40 MG IV SOLR
40.0000 mg | Freq: Two times a day (BID) | INTRAVENOUS | Status: DC
  Administered 2019-01-05: 40 mg via INTRAVENOUS
  Filled 2019-01-04: qty 40

## 2019-01-04 MED ORDER — PAROXETINE HCL 10 MG PO TABS
10.0000 mg | ORAL_TABLET | Freq: Every morning | ORAL | Status: DC
  Administered 2019-01-06 – 2019-01-08 (×3): 10 mg via ORAL
  Filled 2019-01-04 (×4): qty 1

## 2019-01-04 MED ORDER — LINAGLIPTIN 5 MG PO TABS
5.0000 mg | ORAL_TABLET | Freq: Every day | ORAL | Status: DC
  Administered 2019-01-05 – 2019-01-08 (×4): 5 mg via ORAL
  Filled 2019-01-04 (×4): qty 1

## 2019-01-04 MED ORDER — ONDANSETRON HCL 4 MG PO TABS: 4.0000 mg | ORAL_TABLET | Freq: Four times a day (QID) | ORAL | Status: DC | PRN

## 2019-01-04 MED ORDER — TRAZODONE HCL 50 MG PO TABS
25.0000 mg | ORAL_TABLET | Freq: Every evening | ORAL | Status: DC | PRN
  Administered 2019-01-06 – 2019-01-07 (×3): 25 mg via ORAL
  Filled 2019-01-04 (×4): qty 1

## 2019-01-04 MED ORDER — ATENOLOL 25 MG PO TABS: 12.5000 mg | ORAL_TABLET | Freq: Two times a day (BID) | ORAL | Status: DC

## 2019-01-04 MED ORDER — AMLODIPINE BESYLATE 5 MG PO TABS
10.0000 mg | ORAL_TABLET | Freq: Every morning | ORAL | Status: DC
  Administered 2019-01-05 – 2019-01-08 (×4): 10 mg via ORAL
  Filled 2019-01-04 (×4): qty 2

## 2019-01-04 MED ORDER — INSULIN ASPART 100 UNIT/ML ~~LOC~~ SOLN
0.0000 [IU] | Freq: Three times a day (TID) | SUBCUTANEOUS | Status: DC
  Administered 2019-01-06: 1 [IU] via SUBCUTANEOUS
  Administered 2019-01-07: 2 [IU] via SUBCUTANEOUS
  Administered 2019-01-08: 1 [IU] via SUBCUTANEOUS

## 2019-01-04 MED ORDER — LEVOTHYROXINE SODIUM 25 MCG PO TABS
125.0000 ug | ORAL_TABLET | Freq: Every day | ORAL | Status: DC
  Administered 2019-01-05 – 2019-01-07 (×3): 125 ug via ORAL
  Filled 2019-01-04 (×3): qty 1

## 2019-01-04 MED ORDER — DEXTROSE-NACL 5-0.45 % IV SOLN: INTRAVENOUS | Status: DC

## 2019-01-04 MED ORDER — LACTATED RINGERS IV SOLN
INTRAVENOUS | Status: DC
  Administered 2019-01-05 – 2019-01-06 (×3): via INTRAVENOUS

## 2019-01-04 MED ORDER — OXYCODONE HCL 5 MG PO TABS: 5.0000 mg | ORAL_TABLET | ORAL | Status: DC | PRN

## 2019-01-04 MED ORDER — ACETAMINOPHEN 325 MG PO TABS: 650.0000 mg | ORAL_TABLET | Freq: Four times a day (QID) | ORAL | Status: DC | PRN

## 2019-01-04 MED ORDER — POLYETHYLENE GLYCOL 3350 17 G PO PACK: 17.0000 g | PACK | Freq: Every day | ORAL | Status: DC | PRN

## 2019-01-04 NOTE — ED Provider Notes (Signed)
[facility]ERGENCY DEPARTMENT Provider Note   CSN: [id number] Arrival date & time: 01/04/19  1732    History   Chief Complaint No chief complaint on file.   HPI Lauren Rose is a 85 y.o. female with a history of anemia, arthritis, skin cancer, hypertension, and hypothyroidism who presents to the emergency department with family concern for possible strokelike symptoms that they noticed this evening when they found her on the floor next to a pool of emesis.  Patient was reportedly at her baseline 24 hours ago and the patient's family left the home.  They found her on the ground earlier today and the patient adamantly denies fall of any kind.  She simply states that she was feeling weak.  Family concern for possible worsening of the patient's left facial droop from known crossbite and some dysarthria.  Patient denies any recent fevers, chills, chest pain, shortness of breath, headaches, neck pain, abdominal pain, or changes in bowel or bladder habits.      Illness  Severity:  Severe Onset quality:  Unable to specify Duration:  24 hours Timing:  Constant Progression:  Unchanged Chronicity:  New Associated symptoms: vomiting   Associated symptoms: no abdominal pain, no chest pain, no cough, no ear pain, no fever, no rash, no shortness of breath and no sore throat     Past Medical History:  Diagnosis Date   Anemia    Arthritis    Cancer ([place])    hx of thyroid cancer   Colon polyps    Depression    Diabetes mellitus without complication ([place])    Frequency    GERD (gastroesophageal reflux disease)    History of gout    History of skin cancer    Hyperlipidemia    Hypertension    Hypothyroidism    Kidney cysts    followed by Dr. 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 Jearld Fenton every 6 months x 2 yrs   Nocturia    PONV (postoperative nausea and vomiting)    Rash    yeast infection under breasts     Patient Active Problem List   Diagnosis Date Noted   Rhabdomyolysis 01/04/2019   Essential hypertension 01/04/2019   Diabetes ([place]) 01/04/2019   Hypothyroidism 01/04/2019   Emesis 01/04/2019   Unwitnessed fall 01/04/2019   Degenerative arthritis of hip, left 07/20/2013    Past Surgical History:  Procedure Laterality Date   COLON SURGERY  2004   colon polyps then infection srg x3   HERNIA REPAIR  2005   THYROIDECTOMY  2006   TOTAL HIP ARTHROPLASTY Left 07/20/2013   Procedure: LEFT TOTAL HIP ARTHROPLASTY ANTERIOR APPROACH;  Surgeon: Mcarthur Rossetti, MD;  Location: [facility];  Service: Orthopedics;  Laterality: Left;     OB History   No obstetric history on file.      Home Medications    Prior to Admission medications   Medication Sig Start Date End Date Taking? Authorizing Provider  amLODipine (NORVASC) 10 MG tablet Take 10 mg by mouth every morning.    [provider]  aspirin EC 325 MG EC tablet Take 1 tablet (325 mg total) by mouth daily with breakfast. 07/23/13   Pete Pelt, PA-C  atenolol (TENORMIN) 25 MG tablet Take 12.5 mg by mouth 2 (two) times daily.    [provider]  calcium-vitamin D (OSCAL WITH D) 500-200 MG-UNIT per tablet Take 1 tablet by  mouth daily with breakfast.    [provider]  Cholecalciferol (VITAMIN D) 2000 UNITS tablet Take 2,000 Units by mouth daily.    [provider]  diclofenac sodium (VOLTAREN) 1 % GEL Apply 2 g topically 4 (four) times daily.    [provider]  estradiol (ESTRACE) 0.1 MG/GM vaginal cream Place 2 g vaginally 2 (two) times a week.    [provider]  etodolac (LODINE) 200 MG capsule Take 200 mg by mouth every morning.    [provider]  hydrochlorothiazide (HYDRODIURIL) 25 MG tablet Take 25 mg by mouth every morning.    [provider]  levothyroxine (SYNTHROID, LEVOTHROID) 125 MCG tablet Take 125 mcg by mouth daily before  breakfast.    [provider]  losartan (COZAAR) 100 MG tablet Take 100 mg by mouth every morning.    [provider]  Multiple Vitamin (MULTIVITAMIN WITH MINERALS) TABS tablet Take 1 tablet by mouth daily.    [provider]  omeprazole (PRILOSEC) 20 MG capsule Take 20 mg by mouth daily.    [provider]  PARoxetine (PAXIL) 10 MG tablet Take 10 mg by mouth every morning.    [provider]  simvastatin (ZOCOR) 10 MG tablet Take 10 mg by mouth every evening.    [provider]  sitaGLIPtin (JANUVIA) 50 MG tablet Take 50 mg by mouth every morning.    [provider]  terbinafine (LAMISIL) 250 MG tablet Take 250 mg by mouth daily. X 14 days starting 07/16/13    [provider]  traMADol (ULTRAM) 50 MG tablet Take 1 tablet (50 mg total) by mouth every 6 (six) hours as needed. 07/23/13   Pete Pelt, PA-C    Family History No family history on file.  Social History Social History   Tobacco Use   Smoking status: Never Smoker   Smokeless tobacco: Never Used  Substance Use Topics   Alcohol use: No   Drug use: No     Allergies   Patient has no known allergies.   Review of Systems Review of Systems  Constitutional: Negative for chills and fever.  HENT: Negative for ear pain and sore throat.   Eyes: Negative for pain and visual disturbance.  Respiratory: Negative for cough and shortness of breath.   Cardiovascular: Negative for chest pain and palpitations.  Gastrointestinal: Positive for vomiting. Negative for abdominal pain.  Genitourinary: Negative for dysuria and hematuria.  Musculoskeletal: Negative for arthralgias and back pain.  Skin: Negative for color change and rash.  Neurological: Positive for weakness. Negative for seizures and syncope.  All other systems reviewed and are negative.    Physical Exam Updated Vital Signs BP (!) 146/88    Pulse 92    Temp (!) 97.4 F (36.3 C) (Oral)    Resp  (!) 23    Ht 5\' 4"  (1.626 m)    Wt 68 kg    SpO2 95%    BMI 25.75 kg/m   Physical Exam Vitals signs and nursing note reviewed.  Constitutional:      General: She is not in acute distress.    Appearance: She is well-developed.  HENT:     Head: Normocephalic.     Comments: Mild area of ecchymosis overlying the left temporal region.  No bony deformities or crepitus present.  Negative battle sign. Eyes:     Conjunctiva/sclera: Conjunctivae normal.     Pupils: Pupils are equal, round, and reactive to light.  Neck:  Musculoskeletal: Normal range of motion and neck supple. No muscular tenderness.  Cardiovascular:     Rate and Rhythm: Normal rate and regular rhythm.     Heart sounds: No murmur.  Pulmonary:     Effort: Pulmonary effort is normal. No respiratory distress.     Breath sounds: Normal breath sounds.  Abdominal:     Palpations: Abdomen is soft.     Tenderness: There is no abdominal tenderness.  Musculoskeletal:     Comments: Mild tenderness palpation overlying the left shoulder.  Full active and passive range of motion is present.  2+ radial and dorsalis pedis pulses bilaterally.  Pelvis is stable to AP and lateral compression without tenderness.  Lower extremities atraumatic with 2+ dorsalis pedis pulses.  Skin:    General: Skin is warm and dry.  Neurological:     Mental Status: She is alert.     Comments: Alert and oriented x3.  Cranial nerves II through XII intact.  Definite crossbite noted.  Negative pronator drift.  Normal finger-to-nose.  5 out of 5 strength in bilateral upper and lower extremities. Slight dysarthria present.       ED Treatments / Results  Labs (all labs ordered are listed, but only abnormal results are displayed) Labs Reviewed  CBC WITH DIFFERENTIAL/PLATELET - Abnormal; Notable for the following components:      Result Value   WBC 19.0 (*)    Neutro Abs 17.1 (*)    Lymphs Abs 0.6 (*)    Abs Immature Granulocytes 0.10 (*)    All other  components within normal limits  COMPREHENSIVE METABOLIC PANEL - Abnormal; Notable for the following components:   Glucose, Bld 182 (*)    AST 42 (*)    GFR calc non Af Amer 53 (*)    All other components within normal limits  TROPONIN I - Abnormal; Notable for the following components:   Troponin I 0.06 (*)    All other components within normal limits  TSH - Abnormal; Notable for the following components:   TSH 0.160 (*)    All other components within normal limits  URINALYSIS, ROUTINE W REFLEX MICROSCOPIC - Abnormal; Notable for the following components:   APPearance HAZY (*)    Hgb urine dipstick SMALL (*)    Protein, ur 100 (*)    Bacteria, UA RARE (*)    All other components within normal limits  TROPONIN I - Abnormal; Notable for the following components:   Troponin I 0.07 (*)    All other components within normal limits  ACETAMINOPHEN LEVEL - Abnormal; Notable for the following components:   Acetaminophen (Tylenol), Serum <10 (*)    All other components within normal limits  CK - Abnormal; Notable for the following components:   Total CK 580 (*)    All other components within normal limits  CBG MONITORING, ED - Abnormal; Notable for the following components:   Glucose-Capillary 169 (*)    All other components within normal limits  GRAM STAIN  URINE CULTURE  LIPASE, BLOOD  MAGNESIUM  SALICYLATE LEVEL  AMMONIA  CBC  CREATININE, SERUM  HEMOGLOBIN O6V  BASIC METABOLIC PANEL  CBC  OCCULT BLOOD X 1 CARD TO LAB, STOOL    EKG EKG Interpretation  Date/Time:  Thursday January 04 2019 17:35:53 EDT Ventricular Rate:  97 PR Interval:    QRS Duration: 102 QT Interval:  364 QTC Calculation: 463 R Axis:   -48 Text Interpretation:  Sinus rhythm Incomplete RBBB and LAFB Low voltage,  precordial leads Consider anterior infarct similar to previous Confirmed by Theotis Burrow [phone]) on 01/04/2019 6:13:14 PM   Radiology Ct Head Wo Contrast  Result Date: 01/04/2019 CLINICAL  DATA:  Recent fall with pain, initial encounter EXAM: CT HEAD WITHOUT CONTRAST CT CERVICAL SPINE WITHOUT CONTRAST TECHNIQUE: Multidetector CT imaging of the head and cervical spine was performed following the standard protocol without intravenous contrast. Multiplanar CT image reconstructions of the cervical spine were also generated. COMPARISON:  None. FINDINGS: CT HEAD FINDINGS Brain: Chronic atrophic and ischemic changes are identified. No findings to suggest acute hemorrhage, acute infarction or space-occupying mass lesion are noted. Vascular: No hyperdense vessel or unexpected calcification. Skull: Normal. Negative for fracture or focal lesion. Sinuses/Orbits: No acute finding. Other: None. CT CERVICAL SPINE FINDINGS Alignment: Within normal limits. Skull base and vertebrae: 7 cervical segments are well visualized. Vertebral body height is well maintained. Multilevel facet hypertrophic changes are seen. Mild osteophytic changes are noted as well. No acute fracture or acute facet abnormality is seen. Soft tissues and spinal canal: Surrounding soft tissue structures are within normal limits. Upper chest: Within normal limits. Other: None IMPRESSION: CT of the head: Chronic atrophic and ischemic changes without acute abnormality. CT of cervical spine: Multilevel degenerative change without acute abnormality. Electronically Signed   By: Inez Catalina M.D.   On: 01/04/2019 18:53   Ct Cervical Spine Wo Contrast  Result Date: 01/04/2019 CLINICAL DATA:  Recent fall with pain, initial encounter EXAM: CT HEAD WITHOUT CONTRAST CT CERVICAL SPINE WITHOUT CONTRAST TECHNIQUE: Multidetector CT imaging of the head and cervical spine was performed following the standard protocol without intravenous contrast. Multiplanar CT image reconstructions of the cervical spine were also generated. COMPARISON:  None. FINDINGS: CT HEAD FINDINGS Brain: Chronic atrophic and ischemic changes are identified. No findings to suggest acute  hemorrhage, acute infarction or space-occupying mass lesion are noted. Vascular: No hyperdense vessel or unexpected calcification. Skull: Normal. Negative for fracture or focal lesion. Sinuses/Orbits: No acute finding. Other: None. CT CERVICAL SPINE FINDINGS Alignment: Within normal limits. Skull base and vertebrae: 7 cervical segments are well visualized. Vertebral body height is well maintained. Multilevel facet hypertrophic changes are seen. Mild osteophytic changes are noted as well. No acute fracture or acute facet abnormality is seen. Soft tissues and spinal canal: Surrounding soft tissue structures are within normal limits. Upper chest: Within normal limits. Other: None IMPRESSION: CT of the head: Chronic atrophic and ischemic changes without acute abnormality. CT of cervical spine: Multilevel degenerative change without acute abnormality. Electronically Signed   By: Inez Catalina M.D.   On: 01/04/2019 18:53   Dg Pelvis Portable  Result Date: 01/04/2019 CLINICAL DATA:  Recent fall with pelvic pain, initial encounter EXAM: PORTABLE PELVIS 1-2 VIEWS COMPARISON:  None. FINDINGS: Left hip replacement is noted. The pelvic ring is intact. Degenerative changes of the lumbar spine are noted. No acute fracture is seen. Postsurgical changes are noted. IMPRESSION: No acute abnormality noted. Electronically Signed   By: Inez Catalina M.D.   On: 01/04/2019 18:29   Dg Chest Portable 1 View  Result Date: 01/04/2019 CLINICAL DATA:  Recent fall with chest pain, initial encounter EXAM: PORTABLE CHEST 1 VIEW COMPARISON:  01/06/18 FINDINGS: Cardiac shadow is within normal limits and stable. Mild aortic calcifications are again seen. The lungs are well aerated bilaterally. No acute bony abnormality is noted. IMPRESSION: No active disease. Electronically Signed   By: Inez Catalina M.D.   On: 01/04/2019 18:27   Dg Shoulder Left  Result  Date: 01/04/2019 CLINICAL DATA:  Recent fall with left shoulder pain, initial encounter  EXAM: LEFT SHOULDER - 2+ VIEW COMPARISON:  None. FINDINGS: Degenerative changes of the acromioclavicular joint are noted. No dislocation or fracture is seen. The underlying rib cage is within normal limits. IMPRESSION: No acute abnormality noted. Electronically Signed   By: Inez Catalina M.D.   On: 01/04/2019 18:28    Procedures Procedures (including critical care time)  Medications Ordered in ED Medications  amLODipine (NORVASC) tablet 10 mg (has no administration in time range)  atenolol (TENORMIN) tablet 12.5 mg (has no administration in time range)  PARoxetine (PAXIL) tablet 10 mg (has no administration in time range)  levothyroxine (SYNTHROID, LEVOTHROID) tablet 125 mcg (has no administration in time range)  linagliptin (TRADJENTA) tablet 5 mg (has no administration in time range)  enoxaparin (LOVENOX) injection 40 mg (has no administration in time range)  acetaminophen (TYLENOL) tablet 650 mg (has no administration in time range)    Or  acetaminophen (TYLENOL) suppository 650 mg (has no administration in time range)  oxyCODONE (Oxy IR/ROXICODONE) immediate release tablet 5 mg (has no administration in time range)  traZODone (DESYREL) tablet 25 mg (has no administration in time range)  polyethylene glycol (MIRALAX / GLYCOLAX) packet 17 g (has no administration in time range)  ondansetron (ZOFRAN) tablet 4 mg (has no administration in time range)    Or  ondansetron (ZOFRAN) injection 4 mg (has no administration in time range)  lactated ringers infusion (has no administration in time range)  insulin aspart (novoLOG) injection 0-9 Units (has no administration in time range)  pantoprazole (PROTONIX) injection 40 mg (has no administration in time range)     Initial Impression / Assessment and Plan / ED Course  I have reviewed the triage vital signs and the nursing notes.  Pertinent labs & imaging results that were available during my care of the patient were reviewed by me and  considered in my medical decision making (see chart for details).       Patient is a 85 year old female with past medical history as stated above who presented to the emergency department for family concerns of potential stroke like symptoms.  Patient was reportedly found on the floor next to a pool of emesis by family who are also concern for possible worsening of known left-sided facial droop from crossbite as well as some dysarthria.  Patient was last known to be normal 24 hours ago when family left her for the night.  Patient adamantly denies any falls and states that she was weak which is why she was on the floor when family arrived.  She denies any other preceding systemic signs or symptoms. She has no complaints at this time.   On initial evaluation of the patient she was hemodynamically stable and nontoxic-appearing.  Patient was afebrile, not tachycardic, mildly hypertensive at 160/66, satting 97% on room air.  She is quickly evaluated for signs of CVA.  Only physical exam findings of note was mild ecchymosis over the left temporal region as well as some dysarthria, which is difficult to say if it is chronic from her crossbite or new.  Given time of symptoms as well as reassuring neurologic examination code stroke was not initiated.  Unclear this time why the patient was on the floor when family arrived.  This raises suspicion for traumatic injuries versus syncopal event versus metabolic derangements versus acute infection.  EKG showing sinus rhythm at 97 bpm with incomplete right bundle blanch block.  No ST or T wave abnormalities concerning for acute ischemia.  CT head showing chronic atrophy and ischemic changes with no evidence of acute abnormalities.  CT C-spine also shows no acute fractures or malalignments.  Chest x-ray with no acute cardiac or pulmonary pathology.  X-ray of the left shoulder without fracture or malalignment.  X-ray of the pelvis also shows no acute traumatic  injuries.  Patient's lab work markable for leukocytosis of 19 with hemoglobin in the normal range.  CMP with no significant metabolic or electrolyte derangements.  Patient's kidney function actually appears to be slightly improved from prior studies.  Patient complained to nursing staff about bug was crawling up the wall which was not actually there.  This in the setting of leukocytosis now more concerning for encephalopathy from infectious source.  Urine was obtained at this time.  Patient also has mild elevation in troponin of 0.06.  There is no prior for comparison and renal function appears to be improved from prior studies.  She is currently asymptomatic with no chest pain or shortness of breath.  Will obtain additional troponin while in the emergency department for trend. No emergency heparinization warranted at this time. Patient also already takes full dose aspirin.   Urinalysis with no signs of infection.  Encephalopathic work-up broadened at this time with acetaminophen level, salicylate level, ammonia, and CK obtained.  Mild rhabdo with no renal impairment. Negative co-ingestions. Ammonia WNL.  I discussed the patient's case with her daughter via telephone, he is adamantly states that her dysarthria is new and does not have any component of slurred speech at baseline.  This was communicated to neurology who recommended MRI for further evaluation.  Case discussed with Dr. Cyndia Skeeters who will admit the for stroke rule out, trending of troponin, and continued monitoring of WBC. She passed bedside swallow study and was able to eat [place] sandwich and applesauce while in ED. She was in stable condition at time of admission.    Final Clinical Impressions(s) / ED Diagnoses   Final diagnoses:  Troponin level elevated  Dysarthria  Traumatic rhabdomyolysis, initial encounter [facility])    ED Discharge Orders    None       Tommie Raymond, MD 01/04/19 2336    Rex Kras, Wenda Overland, MD 01/09/19  1131

## 2019-01-04 NOTE — ED Triage Notes (Signed)
Pt BIB [facility] from home. Pt was found by family lying on floor next to a pile of dark vomit. Pt states that last night she went to restroom but was unable to make it back to bed and laid down on the ground. Upon EMS arrival patient was unsteady on her feet. Pt denies any pain. Last know well last night @ 6 PM.

## 2019-01-04 NOTE — H&P (Signed)
History and Physical    Lauren Rose JME:[id number] DOB: [birth year] DOA: 01/04/2019  PCP: Derinda Late, MD Patient coming from: home  Chief Complaint: "I fell in the bathroom and I could not get up"  HPI: Lauren Rose is a 85 y.o. female with history of anemia, HTN, NIDDM-2, arthritis, hypothyroidism, depression and anemia presenting after found down in the bathroom.   Patient says she went to bathroom and could not get up. This happened about 7 am this morning. She thinks she fell forward but doesn't remember hitting her head and LOC. Per daughter over the phone, she was in her usual state of health yesterday about 6 pm when she saw her. Per daughter, patient called her about 4 pm this afternoon.  Daughter realized that her voice was not normal and went to check on her.  When she arrived, she found her down on the ground by her bed.  Daughter thinks she might be on the ground since last night.  He also noted some dark emesis.  Did not see forming or any seizure-like activity.  She also noted that she is confused but did not notice any focal neuro deficit.  However, she thinks his speech has changed.  She says his speech is usually very clear.  Patient reports using walker intermittently.  Denies URI symptoms, fever, chills, chest pain, dyspnea, palpitation, dizziness, abdominal pain, diarrhea, urinary symptoms or focal neuro symptoms.  However, she reports weakness in her legs.   Patient lives alone.  Per daughter, patient spends most of her days in bed.  Wakes up in the morning and takes a medication.  Eat a bottle of cereal and goes back to bed, then may be a bowl of soup in the evening. Daughter helps with making bed, cleaning the house and other IADLs.   In ED, vital signs significant for mildly elevated blood pressure.  CBC with leukocytosis, bandemia and IGA.  CMP not impressive except for borderline AST.  Initial troponin 0 0.06.  EKG normal sinus rhythm without acute ischemic finding.   Urinalysis with small hemoglobin, 100+ proteinuria and rare bacteria.  TSH low at 0.16.  CT head and cervical spine without acute finding.  DG chest/pelvis/shoulder without acute finding.  Hospitalist service was called for altered mental status.  CK, second troponin, ammonia, Tylenol level, salicylate level were ordered and pending.  Neurology consulted by EDP.  ROS A twelve point review of system negative except for pertinent positives and negatives as history of present illness above.  PMH Past Medical History:  Diagnosis Date   Anemia    Arthritis    Cancer ([place])    hx of thyroid cancer   Colon polyps    Depression    Diabetes mellitus without complication (HCC)    Frequency    GERD (gastroesophageal reflux disease)    History of gout    History of skin cancer    Hyperlipidemia    Hypertension    Hypothyroidism    Kidney cysts    followed by Dr. 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 Jearld Fenton every 6 months x 2 yrs   Nocturia    PONV (postoperative nausea and vomiting)    Rash    yeast infection under breasts   PSH Past Surgical History:  Procedure Laterality Date   COLON SURGERY  2004   colon polyps then infection srg x3   HERNIA REPAIR  2005   THYROIDECTOMY  2006   TOTAL HIP ARTHROPLASTY Left 07/20/2013   Procedure: LEFT TOTAL HIP ARTHROPLASTY ANTERIOR APPROACH;  Surgeon: Mcarthur Rossetti, MD;  Location: [facility];  Service: Orthopedics;  Laterality: Left;   Fam HX No family history of stroke  Social Hx Per HPI  Allergy No Known Allergies Home Meds Prior to Admission medications   Medication Sig Start Date End Date Taking? Authorizing Provider  amLODipine (NORVASC) 10 MG tablet Take 10 mg by mouth every morning.    [provider]  aspirin EC 325 MG EC tablet Take 1 tablet (325 mg total) by mouth daily with breakfast. 07/23/13   Pete Pelt, PA-C  atenolol (TENORMIN) 25 MG tablet Take  12.5 mg by mouth 2 (two) times daily.    [provider]  calcium-vitamin D (OSCAL WITH D) 500-200 MG-UNIT per tablet Take 1 tablet by mouth daily with breakfast.    [provider]  Cholecalciferol (VITAMIN D) 2000 UNITS tablet Take 2,000 Units by mouth daily.    [provider]  diclofenac sodium (VOLTAREN) 1 % GEL Apply 2 g topically 4 (four) times daily.    [provider]  estradiol (ESTRACE) 0.1 MG/GM vaginal cream Place 2 g vaginally 2 (two) times a week.    [provider]  etodolac (LODINE) 200 MG capsule Take 200 mg by mouth every morning.    [provider]  hydrochlorothiazide (HYDRODIURIL) 25 MG tablet Take 25 mg by mouth every morning.    [provider]  levothyroxine (SYNTHROID, LEVOTHROID) 125 MCG tablet Take 125 mcg by mouth daily before breakfast.    [provider]  losartan (COZAAR) 100 MG tablet Take 100 mg by mouth every morning.    [provider]  Multiple Vitamin (MULTIVITAMIN WITH MINERALS) TABS tablet Take 1 tablet by mouth daily.    [provider]  omeprazole (PRILOSEC) 20 MG capsule Take 20 mg by mouth daily.    [provider]  PARoxetine (PAXIL) 10 MG tablet Take 10 mg by mouth every morning.    [provider]  simvastatin (ZOCOR) 10 MG tablet Take 10 mg by mouth every evening.    [provider]  sitaGLIPtin (JANUVIA) 50 MG tablet Take 50 mg by mouth every morning.    [provider]  terbinafine (LAMISIL) 250 MG tablet Take 250 mg by mouth daily. X 14 days starting 07/16/13    [provider]  traMADol (ULTRAM) 50 MG tablet Take 1 tablet (50 mg total) by mouth every 6 (six) hours as needed. 07/23/13   Pete Pelt, PA-C    Physical Exam: Vitals:   01/04/19 2000 01/04/19 2030 01/04/19 2200 01/04/19 2230  BP: (!) 168/65 (!) 158/63 (!) 155/78 (!) 146/88  Pulse: 92 92 93 92  Resp: (!) 21 (!) 25 (!) 21 (!) 23  Temp:        TempSrc:      SpO2: 93% 95% 97% 95%  Weight:      Height:        Constitutional - resting comfortably, no acute distress Eyes - vision grossly intact. Sclera anicteric.  Small bruise lateral to her left eye. Nose - no gross deformity or drainage Mouth - no oral lesions noted Throat - no swelling or erythema Endo - no obvious thyromegaly CV - RRR. (+)S1S2, no murmurs; no JVD or peripheral edema Resp - No increased work of breathing, good aeration bilaterally, no wheeze or crackles GI - (+)BS, soft, non-tender, non-distended MSK -  normal range of motion, no obvious deformity Skin - no obvious rashes or lesions. Bullae over the medial aspect of her right middle finger. Neuro - alert, aware, oriented x4- date.  Cranial nerves intact except for mild slurred speech.  Motor 5/5 in upper extremities, 4/5 in lower extremities.  Light sensation intact in all dermatomes.  Patellar reflex symmetric.  No pronator drift. Psych - calm, normal mood and affect  Labs on Admission: I have personally reviewed following labs and imaging studies  CBC: Recent Labs  Lab 01/04/19 1742  WBC 19.0*  NEUTROABS 17.1*  HGB 12.6  HCT 39.8  MCV 87.9  PLT 751   Basic Metabolic Panel: Recent Labs  Lab 01/04/19 1742  NA 135  K 4.1  CL 100  CO2 23  GLUCOSE 182*  BUN 18  CREATININE 0.93  CALCIUM 9.6  MG 1.7   GFR: Estimated Creatinine Clearance: 35 mL/min (by C-G formula based on SCr of 0.93 mg/dL). Liver Function Tests: Recent Labs  Lab 01/04/19 1742  AST 42*  ALT 24  ALKPHOS 57  BILITOT 0.7  PROT 7.4  ALBUMIN 3.5   Recent Labs  Lab 01/04/19 1742  LIPASE 23   Recent Labs  Lab 01/04/19 2115  AMMONIA 12   Coagulation Profile: No results for input(s): INR, PROTIME in the last 168 hours. Cardiac Enzymes: Recent Labs  Lab 01/04/19 1742 01/04/19 2115  CKTOTAL  --  580*  TROPONINI 0.06* 0.07*   BNP (last 3 results) No results for input(s): PROBNP in the last 8760  hours. HbA1C: No results for input(s): HGBA1C in the last 72 hours. CBG: Recent Labs  Lab 01/04/19 1753  GLUCAP 169*   Lipid Profile: No results for input(s): CHOL, HDL, LDLCALC, TRIG, CHOLHDL, LDLDIRECT in the last 72 hours. Thyroid Function Tests: Recent Labs    01/04/19 1749  TSH 0.160*   Anemia Panel: No results for input(s): VITAMINB12, FOLATE, FERRITIN, TIBC, IRON, RETICCTPCT in the last 72 hours. Urine analysis:    Component Value Date/Time   COLORURINE YELLOW 01/04/2019 1930   APPEARANCEUR HAZY (A) 01/04/2019 1930   LABSPEC 1.013 01/04/2019 1930   PHURINE 6.0 01/04/2019 1930   GLUCOSEU NEGATIVE 01/04/2019 1930   HGBUR SMALL (A) 01/04/2019 1930   BILIRUBINUR NEGATIVE 01/04/2019 1930   KETONESUR NEGATIVE 01/04/2019 1930   PROTEINUR 100 (A) 01/04/2019 1930   UROBILINOGEN 0.2 07/12/2013 1353   NITRITE NEGATIVE 01/04/2019 1930   LEUKOCYTESUR NEGATIVE 01/04/2019 1930    Sepsis Labs:  Leukocytosis to 16 with bandemia.  Radiological Exams on Admission: Ct Head Wo Contrast  Result Date: 01/04/2019 CLINICAL DATA:  Recent fall with pain, initial encounter EXAM: CT HEAD WITHOUT CONTRAST CT CERVICAL SPINE WITHOUT CONTRAST TECHNIQUE: Multidetector CT imaging of the head and cervical spine was performed following the standard protocol without intravenous contrast. Multiplanar CT image reconstructions of the cervical spine were also generated. COMPARISON:  None. FINDINGS: CT HEAD FINDINGS Brain: Chronic atrophic and ischemic changes are identified. No findings to suggest acute hemorrhage, acute infarction or space-occupying mass lesion are noted. Vascular: No hyperdense vessel or unexpected calcification. Skull: Normal. Negative for fracture or focal lesion. Sinuses/Orbits: No acute finding. Other: None. CT CERVICAL SPINE FINDINGS Alignment: Within normal limits. Skull base and vertebrae: 7 cervical segments are well visualized. Vertebral body height is well maintained. Multilevel  facet hypertrophic changes are seen. Mild osteophytic changes are noted as well. No acute fracture or acute facet abnormality is seen. Soft tissues and spinal canal: Surrounding soft tissue  structures are within normal limits. Upper chest: Within normal limits. Other: None IMPRESSION: CT of the head: Chronic atrophic and ischemic changes without acute abnormality. CT of cervical spine: Multilevel degenerative change without acute abnormality. Electronically Signed   By: Inez Catalina M.D.   On: 01/04/2019 18:53   Ct Cervical Spine Wo Contrast  Result Date: 01/04/2019 CLINICAL DATA:  Recent fall with pain, initial encounter EXAM: CT HEAD WITHOUT CONTRAST CT CERVICAL SPINE WITHOUT CONTRAST TECHNIQUE: Multidetector CT imaging of the head and cervical spine was performed following the standard protocol without intravenous contrast. Multiplanar CT image reconstructions of the cervical spine were also generated. COMPARISON:  None. FINDINGS: CT HEAD FINDINGS Brain: Chronic atrophic and ischemic changes are identified. No findings to suggest acute hemorrhage, acute infarction or space-occupying mass lesion are noted. Vascular: No hyperdense vessel or unexpected calcification. Skull: Normal. Negative for fracture or focal lesion. Sinuses/Orbits: No acute finding. Other: None. CT CERVICAL SPINE FINDINGS Alignment: Within normal limits. Skull base and vertebrae: 7 cervical segments are well visualized. Vertebral body height is well maintained. Multilevel facet hypertrophic changes are seen. Mild osteophytic changes are noted as well. No acute fracture or acute facet abnormality is seen. Soft tissues and spinal canal: Surrounding soft tissue structures are within normal limits. Upper chest: Within normal limits. Other: None IMPRESSION: CT of the head: Chronic atrophic and ischemic changes without acute abnormality. CT of cervical spine: Multilevel degenerative change without acute abnormality. Electronically Signed   By:  Inez Catalina M.D.   On: 01/04/2019 18:53   Dg Pelvis Portable  Result Date: 01/04/2019 CLINICAL DATA:  Recent fall with pelvic pain, initial encounter EXAM: PORTABLE PELVIS 1-2 VIEWS COMPARISON:  None. FINDINGS: Left hip replacement is noted. The pelvic ring is intact. Degenerative changes of the lumbar spine are noted. No acute fracture is seen. Postsurgical changes are noted. IMPRESSION: No acute abnormality noted. Electronically Signed   By: Inez Catalina M.D.   On: 01/04/2019 18:29   Dg Chest Portable 1 View  Result Date: 01/04/2019 CLINICAL DATA:  Recent fall with chest pain, initial encounter EXAM: PORTABLE CHEST 1 VIEW COMPARISON:  01/06/18 FINDINGS: Cardiac shadow is within normal limits and stable. Mild aortic calcifications are again seen. The lungs are well aerated bilaterally. No acute bony abnormality is noted. IMPRESSION: No active disease. Electronically Signed   By: Inez Catalina M.D.   On: 01/04/2019 18:27   Dg Shoulder Left  Result Date: 01/04/2019 CLINICAL DATA:  Recent fall with left shoulder pain, initial encounter EXAM: LEFT SHOULDER - 2+ VIEW COMPARISON:  None. FINDINGS: Degenerative changes of the acromioclavicular joint are noted. No dislocation or fracture is seen. The underlying rib cage is within normal limits. IMPRESSION: No acute abnormality noted. Electronically Signed   By: Inez Catalina M.D.   On: 01/04/2019 18:28    All images have been reviewed by me personally.   EKG: Independently reviewed.  Normal sinus rhythm without acute ischemic finding.  Assessment/Plan Principal Problem:   Rhabdomyolysis Active Problems:   Essential hypertension   Diabetes (HCC)   Hypothyroidism   Emesis   Unwitnessed fall  Mild rhabdomyolysis/unwitnessed fall: patient was found down in her bedroom after presumed fall.  CK mildly elevated to 500s.  Renal function within normal range.  Imaging including CT head, cervical spine, DG chest, shoulder and pelvis without acute  finding. -LR at 75 cc per hr -BMP in the morning. -Recheck CK. -PT/OT  Slurred speech: clear speech at baseline per daughter.  Neuro exam  otherwise reassuring.  CT head negative.  Last known to be normal greater than 24. -Neurology consulted by EDP.  -Continue home statin.  Elevated troponin: Doubt ACS without cardiac symptoms.  EKG without acute ischemic finding.  Suspect this to be related to her rhabdomyolysis. -Continue cycling -Repeat EKG in the morning  Hypertension: SBP slightly elevated. -Continue home atenolol and amlodipine. -Hold hydrochlorothiazide and valsartan.  Emesis: daughter reports dark emesis.  Patient has no epigastric pain.  ASA and etodolac listed in her chart but patient has not been taking this per daughter.  Hemoglobin within normal range.  Hemodynamically stable. -Continue monitoring -FOBT -P.o. Protonix.  Leukocytosis: No clear source of this at this time.  UA is not convincing.  She has no UTI symptoms.  No respiratory symptoms. -Continue trending.  NIDDM-2: CBG mildly elevated. -Check A1c -CBG monitoring -SSI-thin -Continue Januvia.  Low TSH:  -Difficult to interpret in acute setting.  DVT prophylaxis: Lovenox Code Status: DNR Family Communication: Discussed with patient's daughter over the phone. Disposition Plan: Admit to medical telemetry Consults called: Neurology consulted by EDP Admission status: Inpatient   Mercy Riding MD [facility]  If 7PM-7AM, please contact night-coverage [email or web address] Password [email or web address]  01/04/2019, 11:01 PM

## 2019-01-04 NOTE — ED Notes (Signed)
ED TO INPATIENT HANDOFF REPORT  ED Nurse Name and Phone #:  Joellen Jersey [phone]  S Name/Age/Gender Lauren Rose 85 y.o. female Room/Bed: 017C/017C  Code Status   Code Status: DNR  Home/SNF/Other Home Patient oriented to: self, place, time and situation Is this baseline? Yes   Triage Complete: Triage complete  Chief Complaint Stroke Sx  Triage Note Pt BIB [facility] from home. Pt was found by family lying on floor next to a pile of dark vomit. Pt states that last night she went to restroom but was unable to make it back to bed and laid down on the ground. Upon EMS arrival patient was unsteady on her feet. Pt denies any pain. Last know well last night @ 6 PM.   Allergies No Known Allergies  Level of Care/Admitting Diagnosis ED Disposition    ED Disposition Condition [facility] Area: [place] [[id number]]  Level of Care: Telemetry Medical [104]  Diagnosis: Rhabdomyolysis [728.88.ICD-9-CM]  Admitting Physician: Mercy Riding [[id number]]  Attending Physician: Mercy Riding [[id number]]  Estimated length of stay: past midnight tomorrow  Certification:: I certify this patient will need inpatient services for at least 2 midnights  PT Class (Do Not Modify): Inpatient [101]  PT Acc Code (Do Not Modify): Private [1]       B Medical/Surgery History Past Medical History:  Diagnosis Date  . Anemia   . Arthritis   . Cancer (HCC)    hx of thyroid cancer  . Colon polyps   . Depression   . Diabetes mellitus without complication ([facility])   . Frequency   . GERD (gastroesophageal reflux disease)   . History of gout   . History of skin cancer   . Hyperlipidemia   . Hypertension   . Hypothyroidism   . Kidney cysts    followed by Dr. Diona Fanti  . 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 Jearld Fenton every 6 months x 2 yrs  . Nocturia   . PONV (postoperative nausea and vomiting)   . Rash    yeast infection under breasts   Past Surgical  History:  Procedure Laterality Date  . COLON SURGERY  2004   colon polyps then infection srg x3  . HERNIA REPAIR  2005  . THYROIDECTOMY  2006  . TOTAL HIP ARTHROPLASTY Left 07/20/2013   Procedure: LEFT TOTAL HIP ARTHROPLASTY ANTERIOR APPROACH;  Surgeon: Mcarthur Rossetti, MD;  Location: [facility];  Service: Orthopedics;  Laterality: Left;     A IV Location/Drains/Wounds Patient Lines/Drains/Airways Status   Active Line/Drains/Airways    Name:   Placement date:   Placement time:   Site:   Days:   Peripheral IV 01/04/19 Left Antecubital   01/04/19    1748    Antecubital   less than 1   Airway   07/20/13    1355     1994   Incision 07/20/13 Hip Left   07/20/13    1445     1994          Intake/Output Last 24 hours No intake or output data in the 24 hours ending 01/04/19 2313  Labs/Imaging Results for orders placed or performed during the hospital encounter of 01/04/19 (from the past 48 hour(s))  CBC with Differential     Status: Abnormal   Collection Time: 01/04/19  5:42 PM  Result Value Ref Range   WBC 19.0 (H) 4.0 - 10.5 K/uL  RBC 4.53 3.87 - 5.11 MIL/uL   Hemoglobin 12.6 12.0 - 15.0 g/dL   HCT 39.8 36.0 - 46.0 %   MCV 87.9 80.0 - 100.0 fL   MCH 27.8 26.0 - 34.0 pg   MCHC 31.7 30.0 - 36.0 g/dL   RDW 14.5 11.5 - 15.5 %   Platelets 319 150 - 400 K/uL   nRBC 0.0 0.0 - 0.2 %   Neutrophils Relative % 90 %   Neutro Abs 17.1 (H) 1.7 - 7.7 K/uL   Lymphocytes Relative 3 %   Lymphs Abs 0.6 (L) 0.7 - 4.0 K/uL   Monocytes Relative 5 %   Monocytes Absolute 1.0 0.1 - 1.0 K/uL   Eosinophils Relative 1 %   Eosinophils Absolute 0.1 0.0 - 0.5 K/uL   Basophils Relative 0 %   Basophils Absolute 0.0 0.0 - 0.1 K/uL   Immature Granulocytes 1 %   Abs Immature Granulocytes 0.10 (H) 0.00 - 0.07 K/uL    Comment: Performed at [facility] [address] [place], [place] [address]  Comprehensive metabolic panel     Status: Abnormal   Collection Time: 01/04/19  5:42 PM  Result  Value Ref Range   Sodium 135 135 - 145 mmol/L   Potassium 4.1 3.5 - 5.1 mmol/L   Chloride 100 98 - 111 mmol/L   CO2 23 22 - 32 mmol/L   Glucose, Bld 182 (H) 70 - 99 mg/dL   BUN 18 8 - 23 mg/dL   Creatinine, Ser 0.93 0.44 - 1.00 mg/dL   Calcium 9.6 8.9 - 10.3 mg/dL   Total Protein 7.4 6.5 - 8.1 g/dL   Albumin 3.5 3.5 - 5.0 g/dL   AST 42 (H) 15 - 41 U/L   ALT 24 0 - 44 U/L   Alkaline Phosphatase 57 38 - 126 U/L   Total Bilirubin 0.7 0.3 - 1.2 mg/dL   GFR calc non Af Amer 53 (L) >60 mL/min   GFR calc Af Amer >60 >60 mL/min   Anion gap 12 5 - 15    Comment: Performed at [facility] [address], [place], [place] [address]  Lipase, blood     Status: None   Collection Time: 01/04/19  5:42 PM  Result Value Ref Range   Lipase 23 11 - 51 U/L    Comment: Performed at [facility], [place] [address] [place], [place] [address]  Troponin I - ONCE - STAT     Status: Abnormal   Collection Time: 01/04/19  5:42 PM  Result Value Ref Range   Troponin I 0.06 (HH) <0.03 ng/mL    Comment: CRITICAL RESULT CALLED TO, READ BACK BY AND VERIFIED WITH: HARDY,R RN 01/04/2019 1849 JORDANS Performed at [facility], [place] [address] [place], [place] [address]   Magnesium     Status: None   Collection Time: 01/04/19  5:42 PM  Result Value Ref Range   Magnesium 1.7 1.7 - 2.4 mg/dL    Comment: Performed at [facility], [place] [address], [place], [place] [address]  TSH     Status: Abnormal   Collection Time: 01/04/19  5:49 PM  Result Value Ref Range   TSH 0.160 (L) 0.350 - 4.500 uIU/mL    Comment: Performed by a 3rd Generation assay with a functional sensitivity of <=0.01 uIU/mL. Performed at [facility], [place] [address], [place], [place] [address]   POC CBG, ED     Status:  Abnormal   Collection Time: 01/04/19  5:53 PM  Result Value Ref Range   Glucose-Capillary 169 (H) 70 - 99 mg/dL  Urinalysis, Routine w reflex microscopic     Status: Abnormal   Collection Time: 01/04/19   7:30 PM  Result Value Ref Range   Color, Urine YELLOW YELLOW   APPearance HAZY (A) CLEAR   Specific Gravity, Urine 1.013 1.005 - 1.030   pH 6.0 5.0 - 8.0   Glucose, UA NEGATIVE NEGATIVE mg/dL   Hgb urine dipstick SMALL (A) NEGATIVE   Bilirubin Urine NEGATIVE NEGATIVE   Ketones, ur NEGATIVE NEGATIVE mg/dL   Protein, ur 100 (A) NEGATIVE mg/dL   Nitrite NEGATIVE NEGATIVE   Leukocytes,Ua NEGATIVE NEGATIVE   RBC / HPF 0-5 0 - 5 RBC/hpf   WBC, UA 11-20 0 - 5 WBC/hpf   Bacteria, UA RARE (A) NONE SEEN   Squamous Epithelial / LPF 0-5 0 - 5    Comment: Performed at [facility], [address], [place], [place] [address]  Gram stain     Status: None   Collection Time: 01/04/19  7:38 PM  Result Value Ref Range   Specimen Description URINE, CLEAN CATCH    Special Requests NONE    Gram Stain      WBC PRESENT, PREDOMINANTLY PMN GRAM NEGATIVE RODS GRAM POSITIVE COCCI CYTOSPIN SMEAR Performed at [facility], [place] [address] [place], [place] [address]    Report Status 01/04/2019 FINAL   Troponin I - ONCE - STAT     Status: Abnormal   Collection Time: 01/04/19  9:15 PM  Result Value Ref Range   Troponin I 0.07 (HH) <0.03 ng/mL    Comment: CRITICAL VALUE NOTED.  VALUE IS CONSISTENT WITH PREVIOUSLY REPORTED AND CALLED VALUE. Performed at [facility], [place] [address] [place], [place] [address]   Acetaminophen level     Status: Abnormal   Collection Time: 01/04/19  9:15 PM  Result Value Ref Range   Acetaminophen (Tylenol), Serum <10 (L) 10 - 30 ug/mL    Comment: (NOTE) Therapeutic concentrations vary significantly. A range of 10-30 ug/mL  may be an effective concentration for many patients. However, some  are best treated at concentrations outside of this range. Acetaminophen concentrations >150 ug/mL at 4 hours after ingestion  and >50 ug/mL at 12 hours after ingestion are often associated with  toxic reactions. Performed at [facility], [place] [address]  [place], [place] [address]   Salicylate level     Status: None   Collection Time: 01/04/19  9:15 PM  Result Value Ref Range   Salicylate Lvl <4.5 2.8 - 30.0 mg/dL    Comment: Performed at [facility] [address] [place], [place] [address]  Ammonia     Status: None   Collection Time: 01/04/19  9:15 PM  Result Value Ref Range   Ammonia 12 9 - 35 umol/L    Comment: Performed at [facility], [place] [address] [place], [place] [address]  CK     Status: Abnormal   Collection Time: 01/04/19  9:15 PM  Result Value Ref Range   Total CK 580 (H) 38 - 234 U/L    Comment: Performed at [facility], [place] [address] [place], [place] [address]   Ct Head Wo Contrast  Result Date: 01/04/2019 CLINICAL DATA:  Recent fall with pain, initial encounter EXAM: CT HEAD WITHOUT CONTRAST CT CERVICAL SPINE WITHOUT CONTRAST TECHNIQUE:  Multidetector CT imaging of the head and cervical spine was performed following the standard protocol without intravenous contrast. Multiplanar CT image reconstructions of the cervical spine were also generated. COMPARISON:  None. FINDINGS: CT HEAD FINDINGS Brain: Chronic atrophic and ischemic changes are identified. No findings to suggest acute hemorrhage, acute infarction or space-occupying mass lesion are noted. Vascular: No hyperdense vessel or unexpected calcification. Skull: Normal. Negative for fracture or focal lesion. Sinuses/Orbits: No acute finding. Other: None. CT CERVICAL SPINE FINDINGS Alignment: Within normal limits. Skull base and vertebrae: 7 cervical segments are well visualized. Vertebral body height is well maintained. Multilevel facet hypertrophic changes are seen. Mild osteophytic changes are noted as well. No acute fracture or acute facet abnormality is seen. Soft tissues and spinal canal: Surrounding soft tissue structures are within normal limits. Upper chest: Within normal limits. Other: None IMPRESSION: CT of the head: Chronic atrophic and ischemic  changes without acute abnormality. CT of cervical spine: Multilevel degenerative change without acute abnormality. Electronically Signed   By: Inez Catalina M.D.   On: 01/04/2019 18:53   Ct Cervical Spine Wo Contrast  Result Date: 01/04/2019 CLINICAL DATA:  Recent fall with pain, initial encounter EXAM: CT HEAD WITHOUT CONTRAST CT CERVICAL SPINE WITHOUT CONTRAST TECHNIQUE: Multidetector CT imaging of the head and cervical spine was performed following the standard protocol without intravenous contrast. Multiplanar CT image reconstructions of the cervical spine were also generated. COMPARISON:  None. FINDINGS: CT HEAD FINDINGS Brain: Chronic atrophic and ischemic changes are identified. No findings to suggest acute hemorrhage, acute infarction or space-occupying mass lesion are noted. Vascular: No hyperdense vessel or unexpected calcification. Skull: Normal. Negative for fracture or focal lesion. Sinuses/Orbits: No acute finding. Other: None. CT CERVICAL SPINE FINDINGS Alignment: Within normal limits. Skull base and vertebrae: 7 cervical segments are well visualized. Vertebral body height is well maintained. Multilevel facet hypertrophic changes are seen. Mild osteophytic changes are noted as well. No acute fracture or acute facet abnormality is seen. Soft tissues and spinal canal: Surrounding soft tissue structures are within normal limits. Upper chest: Within normal limits. Other: None IMPRESSION: CT of the head: Chronic atrophic and ischemic changes without acute abnormality. CT of cervical spine: Multilevel degenerative change without acute abnormality. Electronically Signed   By: Inez Catalina M.D.   On: 01/04/2019 18:53   Dg Pelvis Portable  Result Date: 01/04/2019 CLINICAL DATA:  Recent fall with pelvic pain, initial encounter EXAM: PORTABLE PELVIS 1-2 VIEWS COMPARISON:  None. FINDINGS: Left hip replacement is noted. The pelvic ring is intact. Degenerative changes of the lumbar spine are noted. No acute  fracture is seen. Postsurgical changes are noted. IMPRESSION: No acute abnormality noted. Electronically Signed   By: Inez Catalina M.D.   On: 01/04/2019 18:29   Dg Chest Portable 1 View  Result Date: 01/04/2019 CLINICAL DATA:  Recent fall with chest pain, initial encounter EXAM: PORTABLE CHEST 1 VIEW COMPARISON:  01/06/18 FINDINGS: Cardiac shadow is within normal limits and stable. Mild aortic calcifications are again seen. The lungs are well aerated bilaterally. No acute bony abnormality is noted. IMPRESSION: No active disease. Electronically Signed   By: Inez Catalina M.D.   On: 01/04/2019 18:27   Dg Shoulder Left  Result Date: 01/04/2019 CLINICAL DATA:  Recent fall with left shoulder pain, initial encounter EXAM: LEFT SHOULDER - 2+ VIEW COMPARISON:  None. FINDINGS: Degenerative changes of the acromioclavicular joint are noted. No dislocation or fracture is seen. The underlying rib cage is within normal limits. IMPRESSION: No acute  abnormality noted. Electronically Signed   By: Inez Catalina M.D.   On: 01/04/2019 18:28    Pending Labs Unresulted Labs (From admission, onward)    Start     Ordered   01/11/19 0500  Creatinine, serum  (enoxaparin (LOVENOX)    CrCl < 30 ml/min)  Weekly,   R    Comments:  while on enoxaparin therapy.    01/04/19 2226   01/05/19 5009  Basic metabolic panel  Tomorrow morning,   R     01/04/19 2226   01/05/19 0500  CBC  Tomorrow morning,   R     01/04/19 2226   01/04/19 2308  Occult blood card to lab, stool  Once,   R     01/04/19 2307   01/04/19 2225  Hemoglobin A1c  Once,   R     01/04/19 2226   01/04/19 2222  CBC  (enoxaparin (LOVENOX)    CrCl < 30 ml/min)  Once,   R    Comments:  Baseline for enoxaparin therapy IF NOT ALREADY DRAWN.  Notify MD if PLT < 100 K.    01/04/19 2226   01/04/19 2222  Creatinine, serum  (enoxaparin (LOVENOX)    CrCl < 30 ml/min)  Once,   R    Comments:  Baseline for enoxaparin therapy IF NOT ALREADY DRAWN.    01/04/19 2226    01/04/19 1821  Urine culture  ONCE - STAT,   STAT     01/04/19 1820          Vitals/Pain Today's Vitals   01/04/19 2030 01/04/19 2200 01/04/19 2230 01/04/19 2230  BP: (!) 158/63 (!) 155/78 (!) 146/88   Pulse: 92 93 92   Resp: (!) 25 (!) 21 (!) 23   Temp:      TempSrc:      SpO2: 95% 97% 95%   Weight:      Height:      PainSc:    0-No pain    Isolation Precautions No active isolations  Medications Medications  amLODipine (NORVASC) tablet 10 mg (has no administration in time range)  atenolol (TENORMIN) tablet 12.5 mg (has no administration in time range)  PARoxetine (PAXIL) tablet 10 mg (has no administration in time range)  levothyroxine (SYNTHROID, LEVOTHROID) tablet 125 mcg (has no administration in time range)  linagliptin (TRADJENTA) tablet 5 mg (has no administration in time range)  enoxaparin (LOVENOX) injection 40 mg (has no administration in time range)  acetaminophen (TYLENOL) tablet 650 mg (has no administration in time range)    Or  acetaminophen (TYLENOL) suppository 650 mg (has no administration in time range)  oxyCODONE (Oxy IR/ROXICODONE) immediate release tablet 5 mg (has no administration in time range)  traZODone (DESYREL) tablet 25 mg (has no administration in time range)  polyethylene glycol (MIRALAX / GLYCOLAX) packet 17 g (has no administration in time range)  ondansetron (ZOFRAN) tablet 4 mg (has no administration in time range)    Or  ondansetron (ZOFRAN) injection 4 mg (has no administration in time range)  lactated ringers infusion (has no administration in time range)  insulin aspart (novoLOG) injection 0-9 Units (has no administration in time range)  pantoprazole (PROTONIX) injection 40 mg (has no administration in time range)    Mobility walks Moderate fall risk   Focused Assessments Neuro Assessment Handoff:  Swallow screen pass? Yes  Cardiac Rhythm: Normal sinus rhythm NIH Stroke Scale ( + Modified Stroke Scale Criteria)  Interval:  Initial Level of Consciousness (1a.)   :  Alert, keenly responsive LOC Questions (1b. )   +: Answers both questions correctly LOC Commands (1c. )   + : Performs both tasks correctly Best Gaze (2. )  +: Normal Visual (3. )  +: No visual loss Facial Palsy (4. )    : Partial paralysis  Motor Arm, Left (5a. )   +: No drift Motor Arm, Right (5b. )   +: No drift Motor Leg, Left (6a. )   +: No drift Motor Leg, Right (6b. )   +: No drift Limb Ataxia (7. ): Absent Sensory (8. )   +: Normal, no sensory loss Best Language (9. )   +: No aphasia Dysarthria (10. ): Mild-to-moderate dysarthria, patient slurs at least some words and, at worst, can be understood with some difficulty Extinction/Inattention (11.)   +: No Abnormality Modified SS Total  +: 0 Complete NIHSS TOTAL: 4 Last date known well: 01/03/19 Last time known well: 1800 Neuro Assessment: Exceptions to [facility] Neuro Checks:   Initial (01/04/19 1745)  Last Documented NIHSS Modified Score: 0 (01/04/19 1950) Has TPA been given? No If patient is a Neuro Trauma and patient is going to OR before floor call report to [place] nurse: [phone] or [phone]     R Recommendations: See Admitting Provider Note  Report given to:   Additional Notes:  Patient lives at home alone. Per family her speech has always been slightly slurred along with a facial droop but they think the droop is more than normal.

## 2019-01-04 NOTE — ED Notes (Signed)
Patient given turkey sandwich and PO fluids.  

## 2019-01-05 ENCOUNTER — Inpatient Hospital Stay (HOSPITAL_COMMUNITY): Payer: Medicare Other

## 2019-01-05 ENCOUNTER — Encounter (HOSPITAL_COMMUNITY): Payer: Self-pay | Admitting: General Practice

## 2019-01-05 DIAGNOSIS — E890 Postprocedural hypothyroidism: Secondary | ICD-10-CM

## 2019-01-05 DIAGNOSIS — I639 Cerebral infarction, unspecified: Secondary | ICD-10-CM | POA: Diagnosis present

## 2019-01-05 LAB — GLUCOSE, CAPILLARY
GLUCOSE-CAPILLARY: 121 mg/dL — AB (ref 70–99)
Glucose-Capillary: 107 mg/dL — ABNORMAL HIGH (ref 70–99)
Glucose-Capillary: 102 mg/dL — ABNORMAL HIGH (ref 70–99)
Glucose-Capillary: 97 mg/dL (ref 70–99)

## 2019-01-05 LAB — CBC
HCT: 32.3 % — ABNORMAL LOW (ref 36.0–46.0)
HCT: 37.4 % (ref 36.0–46.0)
Hemoglobin: 10.7 g/dL — ABNORMAL LOW (ref 12.0–15.0)
Hemoglobin: 12 g/dL (ref 12.0–15.0)
MCH: 28 pg (ref 26.0–34.0)
MCH: 28.7 pg (ref 26.0–34.0)
MCHC: 32.1 g/dL (ref 30.0–36.0)
MCHC: 33.1 g/dL (ref 30.0–36.0)
MCV: 86.6 fL (ref 80.0–100.0)
MCV: 87.4 fL (ref 80.0–100.0)
Platelets: 268 10*3/uL (ref 150–400)
Platelets: 311 10*3/uL (ref 150–400)
RBC: 3.73 MIL/uL — ABNORMAL LOW (ref 3.87–5.11)
RBC: 4.28 MIL/uL (ref 3.87–5.11)
RDW: 14.6 % (ref 11.5–15.5)
RDW: 14.6 % (ref 11.5–15.5)
WBC: 13.4 10*3/uL — ABNORMAL HIGH (ref 4.0–10.5)
WBC: 14.6 10*3/uL — ABNORMAL HIGH (ref 4.0–10.5)
nRBC: 0 % (ref 0.0–0.2)
nRBC: 0 % (ref 0.0–0.2)

## 2019-01-05 LAB — LIPID PANEL
Cholesterol: 102 mg/dL (ref 0–200)
HDL: 45 mg/dL (ref >40)
LDL Cholesterol: 38 mg/dL (ref 0–99)
TRIGLYCERIDES: 94 mg/dL (ref <150)
Total CHOL/HDL Ratio: 2.3 RATIO
VLDL: 19 mg/dL (ref 0–40)

## 2019-01-05 LAB — BASIC METABOLIC PANEL
Anion gap: 13 (ref 5–15)
BUN: 15 mg/dL (ref 8–23)
CALCIUM: 8.9 mg/dL (ref 8.9–10.3)
CO2: 24 mmol/L (ref 22–32)
Chloride: 99 mmol/L (ref 98–111)
Creatinine, Ser: 0.92 mg/dL (ref 0.44–1.00)
GFR calc Af Amer: >60 mL/min (ref >60)
GFR calc non Af Amer: 53 mL/min — ABNORMAL LOW (ref >60)
Glucose, Bld: 113 mg/dL — ABNORMAL HIGH (ref 70–99)
Potassium: 3.4 mmol/L — ABNORMAL LOW (ref 3.5–5.1)
Sodium: 136 mmol/L (ref 135–145)

## 2019-01-05 LAB — URINE CULTURE: Culture: NO GROWTH

## 2019-01-05 LAB — ECHOCARDIOGRAM COMPLETE
Height: 64 in
Weight: 2224 oz

## 2019-01-05 LAB — CREATININE, SERUM
Creatinine, Ser: 0.93 mg/dL (ref 0.44–1.00)
GFR calc non Af Amer: 53 mL/min — ABNORMAL LOW (ref >60)

## 2019-01-05 LAB — HEMOGLOBIN A1C
HEMOGLOBIN A1C: 6.2 % — AB (ref 4.8–5.6)
Mean Plasma Glucose: 131.24 mg/dL

## 2019-01-05 MED ORDER — STROKE: EARLY STAGES OF RECOVERY BOOK
Freq: Once | Status: AC
  Administered 2019-01-05: 12:00:00

## 2019-01-05 MED ORDER — IOHEXOL 350 MG/ML SOLN
75.0000 mL | Freq: Once | INTRAVENOUS | Status: AC | PRN
  Administered 2019-01-05: 75 mL via INTRAVENOUS

## 2019-01-05 MED ORDER — PANTOPRAZOLE SODIUM 40 MG PO TBEC
40.0000 mg | DELAYED_RELEASE_TABLET | Freq: Two times a day (BID) | ORAL | Status: DC
  Administered 2019-01-05 – 2019-01-08 (×7): 40 mg via ORAL
  Filled 2019-01-05 (×7): qty 1

## 2019-01-05 NOTE — Evaluation (Signed)
Occupational Therapy Evaluation Patient Details Name: Lauren Rose MRN: [id number] DOB: 1922/02/12 Today's Date: 01/05/2019    History of Present Illness 85 year old lady who is independent from home with history of anemia, hypertension, type 2 diabetes, arthritis, hypothyroidism, depression who was admitted last night after she was found in the bathroom by her daughter. Admitted from the emergency room with rhabdomyolysis, fall and leukocytosis.  Initial skeletal survey negative.  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   Clinical Impression   Pt admitted with the above diagnoses and presents with below problem list. Pt will benefit from continued acute OT to address the below listed deficits and maximize independence with basic ADLs. PTA pt reports she was mod I with ADLs, sponge bathes. Family assist with IADLs though she does manage her own medications. Pt reports she mobilizes at home only for basic needs (toileting, eating meals). Pt is currently min guard with LB ADLs and mobility. Will continue to follow acutely. Of note, pt consistently had noticeable cough following drinking sips of water.      Follow Up Recommendations  Home health OT;Supervision - Intermittent(OOB/mobility)    Equipment Recommendations  3 in 1 bedside commode    Recommendations for Other Services  SLP consult     Precautions / Restrictions Precautions Precautions: Fall Restrictions Weight Bearing Restrictions: No      Mobility Bed Mobility Overal bed mobility: Needs Assistance Bed Mobility: Rolling;Sidelying to Sit Rolling: Modified independent (Device/Increase time) Sidelying to sit: Min guard       General bed mobility comments: A little steadying assist to powerup trunk. pt able to use UE to facilitate powerup  Transfers Overall transfer level: Needs assistance Equipment used: Rolling walker (2 wheeled) Transfers: Sit to/from Stand Sit to Stand: Min guard         General  transfer comment: from EOB to recliner    Balance Overall balance assessment: Needs assistance Sitting-balance support: Bilateral upper extremity supported;Feet supported Sitting balance-Leahy Scale: Fair     Standing balance support: Bilateral upper extremity supported;During functional activity Standing balance-Leahy Scale: Poor Standing balance comment: rw for static balance. pt noted to complete pericare in seated position..                           ADL either performed or assessed with clinical judgement   ADL Overall ADL's : Needs assistance/impaired Eating/Feeding: Set up;Sitting   Grooming: Set up;Minimal assistance;Brushing hair Grooming Details (indicate cue type and reason): pt asking for assist to comb hair. reports she goes to the salon to get her hair done at home.  Upper Body Bathing: Set up;Sitting   Lower Body Bathing: Min guard;Sit to/from stand   Upper Body Dressing : Set up;Sitting   Lower Body Dressing: Min guard;Sit to/from stand   Toilet Transfer: Min guard;RW   Toileting- Water quality scientist and Hygiene: Min guard;Sit to/from stand     Tub/Shower Transfer Details (indicate cue type and reason): fearful about falling getting in/out of tub at home. has been sponge bathing Functional mobility during ADLs: Min guard;Rolling walker General ADL Comments: Pt with some unsteadiness during mobility, suspect she is close to her baseline.     Vision Baseline Vision/History: Wears glasses Wears Glasses: At all times       Perception     Praxis      Pertinent Vitals/Pain Pain Assessment: Faces Faces Pain Scale: Hurts a little bit Pain Location: generalized with movement Pain Intervention(s): Monitored during session  Hand Dominance Right   Extremity/Trunk Assessment Upper Extremity Assessment Upper Extremity Assessment: Generalized weakness   Lower Extremity Assessment Lower Extremity Assessment: Defer to PT evaluation    Cervical / Trunk Assessment Cervical / Trunk Assessment: Kyphotic   Communication Communication Communication: No difficulties   Cognition Arousal/Alertness: Awake/alert Behavior During Therapy: WFL for tasks assessed/performed Overall Cognitive Status: Within Functional Limits for tasks assessed                                 General Comments: some slow processing, likely at baseline   General Comments       Exercises     Shoulder Instructions      Home Living Family/patient expects to be discharged to:: Private residence Living Arrangements: Alone Available Help at Discharge: Family;Available PRN/intermittently Type of Home: House Home Access: Stairs to enter [employer] of Steps: 5   Home Layout: Two level;Bed/bath upstairs   Alternate Level Stairs-Rails: Right Bathroom Shower/Tub: Teacher, early years/pre: Standard     Home Equipment: Walker - 2 wheels          Prior Functioning/Environment Level of Independence: Independent with assistive device(s)        Comments: walker for mobility. minimally active at home (to bathroom or kitchen to eat then back to chair/bed) sponge bathes. family assist with IADLs. She reports she does manage her own medications.         OT Problem List: Decreased activity tolerance;Impaired balance (sitting and/or standing);Decreased strength;Decreased knowledge of use of DME or AE;Decreased knowledge of precautions;Pain      OT Treatment/Interventions: Self-care/ADL training;Therapeutic exercise;DME and/or AE instruction;Therapeutic activities;Patient/family education;Balance training;Energy conservation    OT Goals(Current goals can be found in the care plan section) Acute Rehab OT Goals Patient Stated Goal: not stated. OT Goal Formulation: With patient Potential to Achieve Goals: Good ADL Goals Pt Will Perform Grooming: (P) with modified independence;standing Pt Will Perform Lower Body  Bathing: (P) with modified independence;sit to/from stand Pt Will Perform Lower Body Dressing: (P) with modified independence;sit to/from stand Pt Will Transfer to Toilet: (P) with modified independence;ambulating Pt Will Perform Toileting - Clothing Manipulation and hygiene: (P) with modified independence;sit to/from stand Additional ADL Goal #1: (P) Pt will independently verbalize 1 energy conservation strategy to incorporate into ADLs.  OT Frequency: Min 3X/week   Barriers to D/C: Decreased caregiver support  from home alone. family lives in [place] PT/OT/SLP Co-Evaluation/Treatment: Yes Reason for Co-Treatment: For patient/therapist safety   OT goals addressed during session: ADL's and self-care      AM-PAC OT "6 Clicks" Daily Activity     Outcome Measure Help from another person eating meals?: None Help from another person taking care of personal grooming?: None Help from another person toileting, which includes using toliet, bedpan, or urinal?: A Little Help from another person bathing (including washing, rinsing, drying)?: A Little Help from another person to put on and taking off regular upper body clothing?: None Help from another person to put on and taking off regular lower body clothing?: None 6 Click Score: 22   End of Session Equipment Utilized During Treatment: Gait belt;Rolling walker Nurse Communication: Other (comment)(coughing after drinking)  Activity Tolerance: Patient tolerated treatment well Patient left: in chair;with call bell/phone within reach;with chair alarm set  OT Visit Diagnosis: Unsteadiness on feet (R26.81);History of falling (Z91.81)  Time: 5170-0174 OT Time Calculation (min): 33 min Charges:  OT General Charges $OT Visit: 1 Visit OT Evaluation $OT Eval Low Complexity: [place], OT Acute Rehabilitation Services Pager: [phone] Office: [phone]   Hortencia Pilar 01/05/2019, 1:47 PM

## 2019-01-05 NOTE — Progress Notes (Signed)
Thank you for consult on Lauren Rose. Note that patient was admitted yesterday and work up underway. Will await therapy evaluations to help determine most appropriate rehab venue.

## 2019-01-05 NOTE — Progress Notes (Signed)
Echocardiogram 2D Echocardiogram has been performed.  Lauren Rose 01/05/2019, 2:59 PM

## 2019-01-05 NOTE — Evaluation (Addendum)
Clinical/Bedside Swallow Evaluation Patient Details  Name: Lauren Rose MRN: [id number] Date of Birth: 1922/08/07  Today's Date: 01/05/2019 Time: SLP Start Time (ACUTE ONLY): 1350 SLP Stop Time (ACUTE ONLY): 1420 SLP Time Calculation (min) (ACUTE ONLY): 30 min  Past Medical History:  Past Medical History:  Diagnosis Date  . Anemia   . Arthritis   . Cancer (HCC)    hx of thyroid cancer  . Colon polyps   . Depression   . Diabetes mellitus without complication ([place])   . Frequency   . GERD (gastroesophageal reflux disease)   . History of gout   . History of skin cancer   . Hyperlipidemia   . Hypertension   . Hypothyroidism   . Kidney cysts    followed by Dr. Diona Fanti  . 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 Jearld Fenton every 6 months x 2 yrs  . Nocturia   . PONV (postoperative nausea and vomiting)   . Rash    yeast infection under breasts   Past Surgical History:  Past Surgical History:  Procedure Laterality Date  . COLON SURGERY  2004   colon polyps then infection srg x3  . HERNIA REPAIR  2005  . THYROIDECTOMY  2006  . TOTAL HIP ARTHROPLASTY Left 07/20/2013   Procedure: LEFT TOTAL HIP ARTHROPLASTY ANTERIOR APPROACH;  Surgeon: Mcarthur Rossetti, MD;  Location: [facility];  Service: Orthopedics;  Laterality: Left;   HPI:  85 year old lady who is independent from home with history of anemia, hypertension, type 2 diabetes, arthritis, hypothyroidism, depression who was admitted last night after she was found in the bathroom by her daughter.  Patient said that she went to bathroom and lost her balance and fell on the floor and could not get up.  Patient states that the right leg gave up. Admitted from the emergency room with rhabdomyolysis, fall and leukocytosis.  Initial skeletal survey negative.  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   Assessment / Plan / Recommendation Clinical Impression  Pt demonstrates signs concerning for aspiration  including cough and wet vocal quality with both nectar thick liquids and thin. Honey thick liquids did not result in immediate cough though wet vocal quality still present (not cleared from prior trials). Pt dysarthric, tongue mildly weak but pt able to masticate regular solids without difficulty. Recommend regular diet and honey thick liquids. Will plan to f/u with [facility] tomorrow for instrumental assessment of swallowing. Discussed with RN, pt and pts daughter.  SLP Visit Diagnosis: Dysphagia, oropharyngeal phase (R13.12)    Aspiration Risk  Moderate aspiration risk    Diet Recommendation Regular;Honey-thick liquid   Liquid Administration via: Cup;Straw Medication Administration: Whole meds with puree Supervision: Patient able to self feed Compensations: Slow rate;Small sips/bites Postural Changes: Seated upright at 90 degrees    Other  Recommendations     Follow up Recommendations Home Health      Frequency and Duration min 2x/week  2 weeks       Prognosis Prognosis for Safe Diet Advancement: Good Barriers to Reach Goals: Cognitive deficits      Swallow Study   General HPI: 85 year old lady who is independent from home with history of anemia, hypertension, type 2 diabetes, arthritis, hypothyroidism, depression who was admitted last night after she was found in the bathroom by her daughter.  Patient said that she went to bathroom and lost her balance and fell on the floor and could not get up.  Patient states  that the right leg gave up. Admitted from the emergency room with rhabdomyolysis, fall and leukocytosis.  Initial skeletal survey negative.  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 Type of Study: Bedside Swallow Evaluation Previous Swallow Assessment: none Diet Prior to this Study: NPO Temperature Spikes Noted: No Respiratory Status: Room air History of Recent Intubation: No Behavior/Cognition: Alert;Cooperative;Pleasant mood Oral Cavity Assessment: Within  Functional Limits Oral Care Completed by SLP: No Oral Cavity - Dentition: Adequate natural dentition Vision: Functional for self-feeding Self-Feeding Abilities: Able to feed self Patient Positioning: Upright in chair Baseline Vocal Quality: Wet Volitional Cough: Strong Volitional Swallow: Able to elicit    Oral/Motor/Sensory Function Overall Oral Motor/Sensory Function: Mild impairment Lingual ROM: Within Functional Limits Lingual Symmetry: Within Functional Limits Lingual Strength: Reduced   Ice Chips Ice chips: Not tested   Thin Liquid Thin Liquid: Impaired Presentation: Cup;Straw;Self Fed Pharyngeal  Phase Impairments: Wet Vocal Quality;Cough - Immediate;Throat Clearing - Immediate    Nectar Thick Nectar Thick Liquid: Impaired Pharyngeal Phase Impairments: Cough - Immediate;Throat Clearing - Immediate   Honey Thick Honey Thick Liquid: Impaired Pharyngeal Phase Impairments: Wet Vocal Quality   Puree Puree: Within functional limits   Solid     Solid: Within functional limits     [name], MA [place] Pager [phone] Office [phone]  Rodriguez Aguinaldo, Katherene Ponto 01/05/2019,2:27 PM

## 2019-01-05 NOTE — Evaluation (Signed)
Speech Language Pathology Evaluation Patient Details Name: Lauren Rose MRN: [id number] DOB: March 30, 1922 Today's Date: 01/05/2019 Time: 1350-1420 SLP Time Calculation (min) (ACUTE ONLY): 30 min  Problem List:  Patient Active Problem List   Diagnosis Date Noted  . Acute ischemic stroke ([place]) 01/05/2019  . Rhabdomyolysis 01/04/2019  . Essential hypertension 01/04/2019  . Diabetes ([place]) 01/04/2019  . Hypothyroidism 01/04/2019  . Emesis 01/04/2019  . Unwitnessed fall 01/04/2019  . Degenerative arthritis of hip, left 07/20/2013   Past Medical History:  Past Medical History:  Diagnosis Date  . Anemia   . Arthritis   . Cancer (HCC)    hx of thyroid cancer  . Colon polyps   . Depression   . Diabetes mellitus without complication ([place])   . Frequency   . GERD (gastroesophageal reflux disease)   . History of gout   . History of skin cancer   . Hyperlipidemia   . Hypertension   . Hypothyroidism   . Kidney cysts    followed by Dr. Diona Fanti  . 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 Jearld Fenton every 6 months x 2 yrs  . Nocturia   . PONV (postoperative nausea and vomiting)   . Rash    yeast infection under breasts   Past Surgical History:  Past Surgical History:  Procedure Laterality Date  . COLON SURGERY  2004   colon polyps then infection srg x3  . HERNIA REPAIR  2005  . THYROIDECTOMY  2006  . TOTAL HIP ARTHROPLASTY Left 07/20/2013   Procedure: LEFT TOTAL HIP ARTHROPLASTY ANTERIOR APPROACH;  Surgeon: Mcarthur Rossetti, MD;  Location: [facility];  Service: Orthopedics;  Laterality: Left;   HPI:  85 year old lady who is independent from home with history of anemia, hypertension, type 2 diabetes, arthritis, hypothyroidism, depression who was admitted last night after she was found in the bathroom by her daughter.  Patient said that she went to bathroom and lost her balance and fell on the floor and could not get up.  Patient states that the right leg gave up.  Admitted from the emergency room with rhabdomyolysis, fall and leukocytosis.  Initial skeletal survey negative.  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   Assessment / Plan / Recommendation Clinical Impression  Pt demonstrates mild dysarthria with slightly decreased intelligiblity. Pt has mild cognitive decline at baseline with daughter providing assist as needed for higher level tasks. Pt needs assist and learning for awareness of deficits for safety and also compensatory straetegies for speech. Will follow acutely, would benefit from home health SLP f/u.     SLP Assessment  SLP Recommendation/Assessment: Patient needs continued Speech Lanaguage Pathology Services SLP Visit Diagnosis: Dysarthria and anarthria (R47.1)    Follow Up Recommendations  Home health SLP    Frequency and Duration min 2x/week  2 weeks      SLP Evaluation Cognition  Overall Cognitive Status: History of cognitive impairments - at baseline Arousal/Alertness: Awake/alert Orientation Level: Oriented to person;Oriented to place;Oriented to situation;Disoriented to time Attention: Alternating Alternating Attention: Appears intact Memory: Impaired Memory Impairment: Decreased long term memory;Decreased short term memory Decreased Long Term Memory: Verbal complex;Functional complex Awareness: Impaired Awareness Impairment: Intellectual impairment Safety/Judgment: Impaired       Comprehension  Auditory Comprehension Overall Auditory Comprehension: Appears within functional limits for tasks assessed    Expression Verbal Expression Overall Verbal Expression: Appears within functional limits for tasks assessed Written Expression Dominant Hand: Right   Oral / Motor  Oral Motor/Sensory Function Overall Oral Motor/Sensory Function: Mild impairment Facial ROM: Within Functional Limits Facial Symmetry: Within Functional Limits Facial Strength: Within Functional Limits Facial Sensation: Within  Functional Limits Lingual ROM: Within Functional Limits Lingual Symmetry: Within Functional Limits Lingual Strength: Reduced Lingual Sensation: Within Functional Limits Velum: Within Functional Limits Mandible: Within Functional Limits Motor Speech Overall Motor Speech: Impaired Respiration: Within functional limits Phonation: Wet Resonance: Within functional limits Articulation: Impaired Level of Impairment: Conversation Intelligibility: Intelligibility reduced Word: 50-74% accurate Phrase: 50-74% accurate Sentence: 50-74% accurate Conversation: 50-74% accurate Motor Planning: Witnin functional limits Motor Speech Errors: Aware;Consistent   GO                   [name], MA CCC-SLP  Acute Rehabilitation Services Pager [phone] Office [phone]  Lynann Beaver 01/05/2019, 2:34 PM

## 2019-01-05 NOTE — Evaluation (Signed)
Physical Therapy Evaluation Patient Details Name: Lauren Rose MRN: [id number] DOB: 1922/07/06 Today's Date: 01/05/2019   History of Present Illness  85 year old lady who is independent from home with history of anemia, hypertension, type 2 diabetes, arthritis, hypothyroidism, depression who was admitted last night after she was found in the bathroom by her daughter. Admitted from the emergency room with rhabdomyolysis, fall and leukocytosis.  Initial skeletal survey negative.  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  Clinical Impression  Pt admitted with/for fall and on floor for indeterminate time.  MRI shows multiple different territory strokes.  Pt .  Pt currently limited functionally due to the problems listed below.  (see problems list.)  Pt will benefit from PT to maximize function and safety to be able to get home safely with available assist.     Follow Up Recommendations Home health PT;Supervision - Intermittent    Equipment Recommendations  None recommended by PT    Recommendations for Other Services       Precautions / Restrictions Precautions Precautions: Fall Restrictions Weight Bearing Restrictions: No      Mobility  Bed Mobility Overal bed mobility: Needs Assistance Bed Mobility: Rolling;Sidelying to Sit Rolling: Modified independent (Device/Increase time) Sidelying to sit: Min guard       General bed mobility comments: A little steadying assist to powerup trunk. pt able to use UE to facilitate powerup  Transfers Overall transfer level: Needs assistance Equipment used: Rolling walker (2 wheeled) Transfers: Sit to/from Stand Sit to Stand: Min guard         General transfer comment: from EOB to recliner  Ambulation/Gait Ambulation/Gait assistance: Min guard Gait Distance (Feet): 20 Feet(x2) Assistive device: Rolling walker (2 wheeled) Gait Pattern/deviations: Step-through pattern   Gait velocity interpretation: <1.31 ft/sec, indicative  of household ambulator General Gait Details: generally steady, mildly guarded, minimal use of the RW for steadiness.  Stairs            Wheelchair Mobility    Modified Rankin (Stroke Patients Only) Modified Rankin (Stroke Patients Only) Pre-Morbid Rankin Score: No symptoms Modified Rankin: Moderate disability     Balance Overall balance assessment: Needs assistance Sitting-balance support: Bilateral upper extremity supported;Feet supported Sitting balance-Leahy Scale: Fair     Standing balance support: Bilateral upper extremity supported;During functional activity Standing balance-Leahy Scale: Poor Standing balance comment: rw for static balance. pt noted to complete pericare in seated position..                             Pertinent Vitals/Pain Pain Assessment: No/denies pain Faces Pain Scale: Hurts a little bit Pain Location: generalized with movement Pain Intervention(s): Monitored during session    [place] expects to be discharged to:: Private residence Living Arrangements: Alone Available Help at Discharge: Family;Available PRN/intermittently Type of Home: House Home Access: Stairs to enter   Entrance Stairs-Number of Steps: 5 Home Layout: Two level;Bed/bath upstairs Home Equipment: Walker - 2 wheels      Prior Function Level of Independence: Independent with assistive device(s)         Comments: walker for mobility. minimally active at home (to bathroom or kitchen to eat then back to chair/bed) sponge bathes. family assist with IADLs. She reports she does manage her own medications.      Hand Dominance   Dominant Hand: Right    Extremity/Trunk Assessment   Upper Extremity Assessment Upper Extremity Assessment: Generalized weakness    Lower Extremity Assessment  Lower Extremity Assessment: Generalized weakness    Cervical / Trunk Assessment Cervical / Trunk Assessment: Kyphotic  Communication   Communication:  No difficulties  Cognition Arousal/Alertness: Awake/alert Behavior During Therapy: WFL for tasks assessed/performed Overall Cognitive Status: History of cognitive impairments - at baseline                                 General Comments: some slow processing, likely at baseline      General Comments      Exercises     Assessment/Plan    PT Assessment Patient needs continued PT services  PT Problem List Decreased strength;Decreased activity tolerance;Decreased balance;Decreased mobility;Decreased knowledge of use of DME       PT Treatment Interventions Gait training;Functional mobility training;Therapeutic activities;Patient/family education;DME instruction;Balance training    PT Goals (Current goals can be found in the Care Plan section)  Acute Rehab PT Goals Patient Stated Goal: not stated. PT Goal Formulation: With patient Time For Goal Achievement: 01/19/19 Potential to Achieve Goals: Good    Frequency Min 3X/week   Barriers to discharge        Co-evaluation PT/OT/SLP Co-Evaluation/Treatment: Yes Reason for Co-Treatment: For patient/therapist safety PT goals addressed during session: Mobility/safety with mobility OT goals addressed during session: ADL's and self-care       AM-PAC PT "6 Clicks" Mobility  Outcome Measure Help needed turning from your back to your side while in a flat bed without using bedrails?: None Help needed moving from lying on your back to sitting on the side of a flat bed without using bedrails?: None Help needed moving to and from a bed to a chair (including a wheelchair)?: A Little Help needed standing up from a chair using your arms (e.g., wheelchair or bedside chair)?: A Little Help needed to walk in hospital room?: A Little Help needed climbing 3-5 steps with a railing? : A Little 6 Click Score: 20    End of Session   Activity Tolerance: Patient tolerated treatment well Patient left: in chair;with call bell/phone  within reach;with chair alarm set Nurse Communication: Mobility status PT Visit Diagnosis: Unsteadiness on feet (R26.81);Muscle weakness (generalized) (M62.81)    Time: 5361-4431 PT Time Calculation (min) (ACUTE ONLY): 33 min   Charges:   PT Evaluation $PT Eval Moderate Complexity: 1 Mod          01/05/2019  Donnella Sham, PT Acute Rehabilitation Services [phone]  (pager) [phone]  (office)  Tessie Fass Xitlaly Ault 01/05/2019, 3:02 PM

## 2019-01-05 NOTE — Progress Notes (Signed)
Provided Carloyn Manner, radiology assistant from [facility], with pager to night provider [place]. Informed by Carloyn Manner physician [place] (reading patient's MRI results) wanted to discuss MRI finding with provider.

## 2019-01-05 NOTE — Progress Notes (Signed)
Lauren Rose is a 85 y.o. female patient admitted from ED awake, alert - oriented  X 4 - no acute distress noted.  VSS - Blood pressure (!) 170/74, pulse 88, temperature 97.6 F (36.4 C), resp. rate (!) 27, height 5\' 4"  (1.626 m), weight 63 kg, SpO2 97 %.    IV in place, occlusive dsg intact without redness.  Orientation to room, and floor completed with information packet given to patient/family.  Patient declined safety video at this time.  Admission INP armband ID verified with patient/family, and in place.   SR up x 2, fall assessment complete, with patient and family able to verbalize understanding of risk associated with falls, and verbalized understanding to call nsg before up out of bed.    Call light within reach, patient able to voice, and demonstrate understanding.    Skin to skin check performed with second RN. Skin assessment documented.  Skin, clean-dry- intact without evidence of bruising, or skin tears.   No evidence of skin break down noted on exam.    Will cont to eval and treat per MD orders.  Howard Pouch, RN 01/05/2019 2:05 AM

## 2019-01-05 NOTE — Progress Notes (Signed)
Patient off the unit to CT.  Patient alert and oriented times 2.

## 2019-01-05 NOTE — Progress Notes (Signed)
PROGRESS NOTE    Lauren Rose  VZC:[id number] DOB: 1922/04/16 DOA: 01/04/2019 PCP: Derinda Late, MD    Brief Narrative:   85 year old lady who is independent from home with history of anemia, hypertension, type 2 diabetes, arthritis, hypothyroidism, depression who was admitted last night after she was found in the bathroom by her daughter.  Patient said that she went to bathroom and lost her balance and fell on the floor and could not get up.  Patient states that the right leg gave up. Admitted from the emergency room with rhabdomyolysis, fall and leukocytosis.  Initial skeletal survey negative.  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  Assessment & Plan:   Principal Problem:   Rhabdomyolysis Active Problems:   Essential hypertension   Diabetes ([place])   Hypothyroidism   Emesis   Unwitnessed fall   Acute ischemic stroke ([place])  Acute multiple vascular territory stroke: Some with hemorrhagic transformation. Continue to monitor as acute his stroke and telemetry unit.   Neurochecks and vital signs as per stroke protocol. Patient was not a TPA and vascular intervention candidate because of being outside the window.  Last known normal before 6 PM. Patient had past swallow testing at bedside, continue medications and heart healthy diet. Antiplatelets, aspirin was given.  Will hold further until discussion with neurology. Statin, on simvastatin 10 mg at home that we will continue. Blood pressure goals, modified blood pressure control.  Will continue home medications. Consultations, neurology, speech, PT OT MRI of the brain shows multiple vascular territory stroke. CTA of the head and neck done today with no acute obstruction. 2D echocardiogram pending. I called and discussed case with neurology. A1c 6.2.  Lipid profile pending.  Mild rhabdomyolysis: Treated with IV fluids.  Continue IV fluids today.  Improving.  Renal functions normal.  Borderline elevated troponins:  Insignificant.  Type II non-STEMI.  No chest pain.  Leukocytosis: With no evidence of localizing infection.  Probably due to rhabdomyolysis.  We will continue to monitor.  Hypertension: As per #1.  She probably had acute event 24 hours now.  We will gradually control her blood pressure.  Type 2 diabetes: On oral hypoglycemics at home.  A1c 6.2.  Keep on sliding scale insulin.  Will resume oral hypoglycemics on discharge.  Emesis: With no evidence of acute blood loss.  Currently resolved.  Hypothyroidism: Clinically euthyroid.  TSH is 0.16.  Questionable suppression.  However cannot say with acute illness.  Continue same doses of thyroxine.  Will need to repeat in 1 month.  Patient has acute ischemic stroke with neurological deficits.  She will need inpatient investigations, adjustment of medications, rehab assessment and probable transfer to rehab after acute phase of illness.  Anticipate she will stay in the hospital more than 2 midnights.  Will change to inpatient admission.  DVT prophylaxis: SCDs. Code Status: DNR. Family Communication: Updated patient's daughter, Juliann Pulse over the phone. Disposition Plan: Acute inpatient rehab.   Consultants:   Neurology.  Procedures:   None.  Antimicrobials:   None.   Subjective: Patient seen and examined.  She said her right leg gave up.  She had slight difficulty speaking.  Nursing staff noted coughing after taking medications.  No other overnight events.  Denies any history of stroke.  Objective: Vitals:   01/04/19 2230 01/05/19 0003 01/05/19 0544 01/05/19 1018  BP: (!) 146/88 (!) 170/74 (!) 165/67 (!) 160/56  Pulse: 92 88 79 89  Resp: (!) 23 (!) 27 20 16   Temp:  97.6 F (  36.4 C) 98.5 F (36.9 C) 97.9 F (36.6 C)  TempSrc:   Axillary Oral  SpO2: 95% 97% 94% 90%  Weight:  63 kg    Height:  5\' 4"  (1.626 m)      Intake/Output Summary (Last 24 hours) at 01/05/2019 1125 Last data filed at 01/05/2019 0900 Gross per 24 hour  Intake  250 ml  Output --  Net 250 ml   Filed Weights   01/04/19 1747 01/05/19 0003  Weight: 68 kg 63 kg    Examination:  General exam: Appears calm and comfortable.  On room air. Respiratory system: Clear to auscultation. Respiratory effort normal.  Occasional conducted airway sounds. Cardiovascular system: S1 & S2 heard, RRR. No JVD, murmurs, rubs, gallops or clicks. No pedal edema. Gastrointestinal system: Abdomen is nondistended, soft and nontender. No organomegaly or masses felt. Normal bowel sounds heard. Central nervous system: Alert and oriented. No focal neurological deficits. Skin: No rashes, lesions or ulcers Psychiatry: Judgement and insight appear normal. Mood & affect appropriate.  Neuro exam: Cranial nerves exam Right facial droop present.  Dysarthria present. Upper extremity bilateral equal motor power and sensation. Lower extremity , right lower leg power 4/5.  Left normal. Gait and balance not checked.   Data Reviewed: I have personally reviewed following labs and imaging studies  CBC: Recent Labs  Lab 01/04/19 1742 01/04/19 2330 01/05/19 0524  WBC 19.0* 14.6* 13.4*  NEUTROABS 17.1*  --   --   HGB 12.6 12.0 10.7*  HCT 39.8 37.4 32.3*  MCV 87.9 87.4 86.6  PLT 319 311 235   Basic Metabolic Panel: Recent Labs  Lab 01/04/19 1742 01/04/19 2330 01/05/19 0524  NA 135  --  136  K 4.1  --  3.4*  CL 100  --  99  CO2 23  --  24  GLUCOSE 182*  --  113*  BUN 18  --  15  CREATININE 0.93 0.93 0.92  CALCIUM 9.6  --  8.9  MG 1.7  --   --    GFR: Estimated Creatinine Clearance: 32.3 mL/min (by C-G formula based on SCr of 0.92 mg/dL). Liver Function Tests: Recent Labs  Lab 01/04/19 1742  AST 42*  ALT 24  ALKPHOS 57  BILITOT 0.7  PROT 7.4  ALBUMIN 3.5   Recent Labs  Lab 01/04/19 1742  LIPASE 23   Recent Labs  Lab 01/04/19 2115  AMMONIA 12   Coagulation Profile: No results for input(s): INR, PROTIME in the last 168 hours. Cardiac Enzymes: Recent  Labs  Lab 01/04/19 1742 01/04/19 2115  CKTOTAL  --  580*  TROPONINI 0.06* 0.07*   BNP (last 3 results) No results for input(s): PROBNP in the last 8760 hours. HbA1C: Recent Labs    01/04/19 2330  HGBA1C 6.2*   CBG: Recent Labs  Lab 01/04/19 1753 01/05/19 0750  GLUCAP 169* 107*   Lipid Profile: Recent Labs    01/05/19 0524  CHOL 102  HDL 45  LDLCALC 38  TRIG 94  CHOLHDL 2.3   Thyroid Function Tests: Recent Labs    01/04/19 1749  TSH 0.160*   Anemia Panel: No results for input(s): VITAMINB12, FOLATE, FERRITIN, TIBC, IRON, RETICCTPCT in the last 72 hours. Sepsis Labs: No results for input(s): PROCALCITON, LATICACIDVEN in the last 168 hours.  Recent Results (from the past 240 hour(s))  Gram stain     Status: None   Collection Time: 01/04/19  7:38 PM  Result Value Ref Range Status   Specimen  Description URINE, CLEAN CATCH  Final   Special Requests NONE  Final   Gram Stain   Final    WBC PRESENT, PREDOMINANTLY PMN GRAM NEGATIVE RODS GRAM POSITIVE COCCI CYTOSPIN SMEAR Performed at [facility], [place] [address] [place], [place] [address]    Report Status 01/04/2019 FINAL  Final         Radiology Studies: Ct Angio Head W Or Wo Contrast  Result Date: 01/05/2019 CLINICAL DATA:  Cerebral hemorrhage suspected.  Stroke.  Fall. EXAM: CT ANGIOGRAPHY HEAD AND NECK TECHNIQUE: Multidetector CT imaging of the head and neck was performed using the standard protocol during bolus administration of intravenous contrast. Multiplanar CT image reconstructions and MIPs were obtained to evaluate the vascular anatomy. Carotid stenosis measurements (when applicable) are obtained utilizing NASCET criteria, using the distal internal carotid diameter as the denominator. CONTRAST:  82mL OMNIPAQUE IOHEXOL 350 MG/ML SOLN COMPARISON:  CT head 01/04/2019, MRI 01/05/2019 FINDINGS: CTA NECK FINDINGS Aortic arch: Mild atherosclerotic calcification aortic arch without aneurysm or  dissection. Proximal great vessels patent with mild atherosclerotic disease. Right carotid system: Mild atherosclerotic disease right carotid bifurcation without significant stenosis Left carotid system: Mild atherosclerotic disease left carotid bifurcation without significant stenosis. Vertebral arteries: Both vertebral arteries patent to the basilar without significant stenosis. Skeleton: Mild cervical spondylosis.  No acute skeletal abnormality. Other neck: Negative for mass or adenopathy. Upper chest: Lung apices clear bilaterally. Review of the MIP images confirms the above findings CTA HEAD FINDINGS Anterior circulation: Atherosclerotic calcification cavernous carotid bilaterally without significant stenosis. Anterior and middle cerebral arteries patent bilaterally without significant stenosis or occlusion. Posterior circulation: Both vertebral arteries patent to the basilar without stenosis. PICA patent bilaterally. Basilar widely patent. Right AICA patent. Superior cerebellar and posterior cerebral arteries patent bilaterally without significant stenosis. Fetal origin left posterior cerebral artery. Venous sinuses: Patent Anatomic variants: None Delayed phase: Normal enhancement postcontrast administration. Small 5 mm hyperdensity left parietal periventricular white matter unchanged consistent with a small area of acute hemorrhage. Hypodensity central medulla consistent with acute infarct. New area of hypodensity left medial cerebellum may represent artifact versus interval infarction. Chronic ischemic changes in the white matter and pons. Review of the MIP images confirms the above findings IMPRESSION: 1. Mild atherosclerotic disease carotid bifurcation bilaterally. No significant carotid artery or vertebral artery stenosis in the neck. 2. Negative for intracranial large vessel occlusion 3. Hypodensity central medulla compatible with small acute infarct as noted on MRI. Hypodensity left medial mid  cerebellum likely artifact on CT. No acute infarct in this area on recent MRI. Electronically Signed   By: Franchot Gallo M.D.   On: 01/05/2019 10:42   Ct Head Wo Contrast  Result Date: 01/04/2019 CLINICAL DATA:  Recent fall with pain, initial encounter EXAM: CT HEAD WITHOUT CONTRAST CT CERVICAL SPINE WITHOUT CONTRAST TECHNIQUE: Multidetector CT imaging of the head and cervical spine was performed following the standard protocol without intravenous contrast. Multiplanar CT image reconstructions of the cervical spine were also generated. COMPARISON:  None. FINDINGS: CT HEAD FINDINGS Brain: Chronic atrophic and ischemic changes are identified. No findings to suggest acute hemorrhage, acute infarction or space-occupying mass lesion are noted. Vascular: No hyperdense vessel or unexpected calcification. Skull: Normal. Negative for fracture or focal lesion. Sinuses/Orbits: No acute finding. Other: None. CT CERVICAL SPINE FINDINGS Alignment: Within normal limits. Skull base and vertebrae: 7 cervical segments are well visualized. Vertebral body height is well maintained. Multilevel facet hypertrophic changes are seen. Mild osteophytic changes are noted as  well. No acute fracture or acute facet abnormality is seen. Soft tissues and spinal canal: Surrounding soft tissue structures are within normal limits. Upper chest: Within normal limits. Other: None IMPRESSION: CT of the head: Chronic atrophic and ischemic changes without acute abnormality. CT of cervical spine: Multilevel degenerative change without acute abnormality. Electronically Signed   By: Inez Catalina M.D.   On: 01/04/2019 18:53   Ct Angio Neck W Or Wo Contrast  Result Date: 01/05/2019 CLINICAL DATA:  Cerebral hemorrhage suspected.  Stroke.  Fall. EXAM: CT ANGIOGRAPHY HEAD AND NECK TECHNIQUE: Multidetector CT imaging of the head and neck was performed using the standard protocol during bolus administration of intravenous contrast. Multiplanar CT image  reconstructions and MIPs were obtained to evaluate the vascular anatomy. Carotid stenosis measurements (when applicable) are obtained utilizing NASCET criteria, using the distal internal carotid diameter as the denominator. CONTRAST:  106mL OMNIPAQUE IOHEXOL 350 MG/ML SOLN COMPARISON:  CT head 01/04/2019, MRI 01/05/2019 FINDINGS: CTA NECK FINDINGS Aortic arch: Mild atherosclerotic calcification aortic arch without aneurysm or dissection. Proximal great vessels patent with mild atherosclerotic disease. Right carotid system: Mild atherosclerotic disease right carotid bifurcation without significant stenosis Left carotid system: Mild atherosclerotic disease left carotid bifurcation without significant stenosis. Vertebral arteries: Both vertebral arteries patent to the basilar without significant stenosis. Skeleton: Mild cervical spondylosis.  No acute skeletal abnormality. Other neck: Negative for mass or adenopathy. Upper chest: Lung apices clear bilaterally. Review of the MIP images confirms the above findings CTA HEAD FINDINGS Anterior circulation: Atherosclerotic calcification cavernous carotid bilaterally without significant stenosis. Anterior and middle cerebral arteries patent bilaterally without significant stenosis or occlusion. Posterior circulation: Both vertebral arteries patent to the basilar without stenosis. PICA patent bilaterally. Basilar widely patent. Right AICA patent. Superior cerebellar and posterior cerebral arteries patent bilaterally without significant stenosis. Fetal origin left posterior cerebral artery. Venous sinuses: Patent Anatomic variants: None Delayed phase: Normal enhancement postcontrast administration. Small 5 mm hyperdensity left parietal periventricular white matter unchanged consistent with a small area of acute hemorrhage. Hypodensity central medulla consistent with acute infarct. New area of hypodensity left medial cerebellum may represent artifact versus interval infarction.  Chronic ischemic changes in the white matter and pons. Review of the MIP images confirms the above findings IMPRESSION: 1. Mild atherosclerotic disease carotid bifurcation bilaterally. No significant carotid artery or vertebral artery stenosis in the neck. 2. Negative for intracranial large vessel occlusion 3. Hypodensity central medulla compatible with small acute infarct as noted on MRI. Hypodensity left medial mid cerebellum likely artifact on CT. No acute infarct in this area on recent MRI. Electronically Signed   By: Franchot Gallo M.D.   On: 01/05/2019 10:42   Ct Cervical Spine Wo Contrast  Result Date: 01/04/2019 CLINICAL DATA:  Recent fall with pain, initial encounter EXAM: CT HEAD WITHOUT CONTRAST CT CERVICAL SPINE WITHOUT CONTRAST TECHNIQUE: Multidetector CT imaging of the head and cervical spine was performed following the standard protocol without intravenous contrast. Multiplanar CT image reconstructions of the cervical spine were also generated. COMPARISON:  None. FINDINGS: CT HEAD FINDINGS Brain: Chronic atrophic and ischemic changes are identified. No findings to suggest acute hemorrhage, acute infarction or space-occupying mass lesion are noted. Vascular: No hyperdense vessel or unexpected calcification. Skull: Normal. Negative for fracture or focal lesion. Sinuses/Orbits: No acute finding. Other: None. CT CERVICAL SPINE FINDINGS Alignment: Within normal limits. Skull base and vertebrae: 7 cervical segments are well visualized. Vertebral body height is well maintained. Multilevel facet hypertrophic changes are seen. Mild osteophytic changes  are noted as well. No acute fracture or acute facet abnormality is seen. Soft tissues and spinal canal: Surrounding soft tissue structures are within normal limits. Upper chest: Within normal limits. Other: None IMPRESSION: CT of the head: Chronic atrophic and ischemic changes without acute abnormality. CT of cervical spine: Multilevel degenerative change  without acute abnormality. Electronically Signed   By: Inez Catalina M.D.   On: 01/04/2019 18:53   Mr Brain Wo Contrast  Addendum Date: 01/05/2019   ADDENDUM REPORT: 01/05/2019 04:42 ADDENDUM: Findings were communicated by telephone to the covering nurse practitioner [place] at 3:43 a.m. on 01/05/2019. Electronically Signed   By: Jeannine Boga M.D.   On: 01/05/2019 04:42   Result Date: 01/05/2019 CLINICAL DATA:  Initial evaluation for acute TIA, dysarthria. EXAM: MRI HEAD WITHOUT CONTRAST TECHNIQUE: Multiplanar, multiecho pulse sequences of the brain and surrounding structures were obtained without intravenous contrast. COMPARISON:  Prior CT from earlier the same day. FINDINGS: Brain: Diffuse prominence of the CSF containing spaces compatible with generalized age-related cerebral atrophy. Changes are most prominent at the anterior temporal lobes bilaterally. Patchy and confluent T2/FLAIR hyperintensity within the periventricular and deep white matter both cerebral hemispheres most consistent with chronic microvascular ischemic disease. Chronic microvascular ischemic changes present within the pons as well. 6 mm focus of diffusion abnormality seen within the posterior left corona radiata, consistent with a small acute a infarct (series 5, image 59). Associated susceptibility artifact, consistent with hemorrhage. Small focus of hemorrhage seen within this region on prior CT as well. Layering susceptibility artifact within the occipital horns of the lateral ventricles, left greater than right, suggestive of intraventricular extension, age indeterminate, but could be acute to subacute in nature. No hydrocephalus or ventricular trapping. Additional 5 mm focus of diffusion abnormality involving the subcortical white matter of the high anterior left frontal lobe consistent with an acute to subacute ischemic infarct (series 5, image 67). Additionally, there is a 6 mm focus of linear diffusion abnormality  involving the central aspect of the medulla (series 5, image 43). Associated signal loss on ADC map (series 6, image 6). Finding consistent with a small acute to early subacute ischemic infarct. No other evidence for acute or subacute ischemia. Gray-white matter differentiation otherwise maintained. No other areas of remote cortical infarction. No mass lesion, midline shift or mass effect. Trace extra-axial collection measuring 2-3 mm in thickness seen overlying the left occipital convexity, likely a small subdural hematoma, age indeterminate, but could be subacute in nature. This is not well visualized on prior CT. Vascular: Major intracranial vascular flow voids are maintained. Skull and upper cervical spine: Craniocervical junction within normal limits. Upper cervical spine normal. Bone marrow signal intensity within normal limits. Hyperostosis frontalis interna noted. No scalp soft tissue abnormality. Sinuses/Orbits: Globes and orbital soft tissues demonstrate no acute finding. Patient status post bilateral ocular lens replacement. Paranasal sinuses are clear. No mastoid effusion. Inner ear structures grossly normal. Other: None. IMPRESSION: 1. 6 mm acute hemorrhagic infarct involving the periventricular white matter of the posterior left corona radiata without significant mass effect. Associated trace layering intraventricular hemorrhage somewhat age indeterminate, but could be related to intraventricular extension. No hydrocephalus or ventricular trapping. 2. Additional 6 mm linear acute to early subacute ischemic nonhemorrhagic infarct involving the central medulla. 3. Additional 6 mm acute to early subacute ischemic nonhemorrhagic infarct involving the subcortical white matter of the anterior left frontal lobe. 4. Trace 2-3 mm subdural collection overlying the occipital convexity, likely blood products. Finding is age indeterminate,  but could be acute to subacute in nature. No associated mass effect. 5.  Underlying age-related cerebral atrophy with moderate chronic microvascular ischemic disease. A call is in to the ordering clinician. Findings will be communicated as soon as possible. Electronically Signed: By: Jeannine Boga M.D. On: 01/05/2019 03:33   Dg Pelvis Portable  Result Date: 01/04/2019 CLINICAL DATA:  Recent fall with pelvic pain, initial encounter EXAM: PORTABLE PELVIS 1-2 VIEWS COMPARISON:  None. FINDINGS: Left hip replacement is noted. The pelvic ring is intact. Degenerative changes of the lumbar spine are noted. No acute fracture is seen. Postsurgical changes are noted. IMPRESSION: No acute abnormality noted. Electronically Signed   By: Inez Catalina M.D.   On: 01/04/2019 18:29   Dg Chest Portable 1 View  Result Date: 01/04/2019 CLINICAL DATA:  Recent fall with chest pain, initial encounter EXAM: PORTABLE CHEST 1 VIEW COMPARISON:  01/06/18 FINDINGS: Cardiac shadow is within normal limits and stable. Mild aortic calcifications are again seen. The lungs are well aerated bilaterally. No acute bony abnormality is noted. IMPRESSION: No active disease. Electronically Signed   By: Inez Catalina M.D.   On: 01/04/2019 18:27   Dg Shoulder Left  Result Date: 01/04/2019 CLINICAL DATA:  Recent fall with left shoulder pain, initial encounter EXAM: LEFT SHOULDER - 2+ VIEW COMPARISON:  None. FINDINGS: Degenerative changes of the acromioclavicular joint are noted. No dislocation or fracture is seen. The underlying rib cage is within normal limits. IMPRESSION: No acute abnormality noted. Electronically Signed   By: Inez Catalina M.D.   On: 01/04/2019 18:28        Scheduled Meds:   stroke: mapping our early stages of recovery book   Does not apply Once   amLODipine  10 mg Oral q morning - 10a   atenolol  12.5 mg Oral BID   insulin aspart  0-9 Units Subcutaneous TID WC   levothyroxine  125 mcg Oral QHS   linagliptin  5 mg Oral Daily   pantoprazole  40 mg Oral BID   PARoxetine  10 mg  Oral q morning - 10a   Continuous Infusions:  lactated ringers 75 mL/hr at 01/05/19 0054     LOS: 1 day    Time spent: 35 minutes.    Barb Merino, MD [facility] Pager [phone]  If 7PM-7AM, please contact night-coverage [email or web address] Password [facility] 01/05/2019, 11:25 AM

## 2019-01-05 NOTE — Progress Notes (Signed)
Patient returned from CT

## 2019-01-05 NOTE — Consult Note (Addendum)
Neurology Consultation  Reason for Consult: stroke Referring Physician: Dr. Sloan Leiter  CC: Fall  History is obtained from: Patient and Chart  HPI: Lauren Rose is a 85 y.o. female with PMH of HTN, T2DM, hypothyroidism arthritis presenting to [facility] after being found down in pool of vomit by her family. Per chart review last seen normal about 6 pm 01/03/19 by her daughter. Patient states that she got up to go to the restroom last night then found it difficult to get back to bed; she denies falling or not being able to get up from the toilet. She notes right sided leg>arm weakness that started earlier this week, but denies numbness/tinling, dysarthria, dysphagia, vision or hearing changes, headaches, chest pain, palpitations, shortness of breath, fevers, chills, N/V/D (outside of 2 episodes of vomiting last night), melena or hematochezia.  Patient lives by herself since the death of her husband about a year ago. Her family helps with finances, shopping, and cooking. She uses a cane only when outside of her house.   LKW: 6pm 01/03/19 tpa given?: no, outside window Premorbid modified Rankin scale (mRS): 4 0-Completely asymptomatic and back to baseline post-stroke 1-No significant post stroke disability and can perform usual duties with stroke symptoms 2-Slight disability-UNABLE to perform all activities but does not need assistance  3-Moderate disability-requires help but walks WITHOUT assistance 4-Needs assistance to walk and tend to bodily needs 5-Severe disability-bedridden, incontinent, needs constant attention 6- Death  ROS: A 14 point ROS was performed and is negative except as noted in the HPI.  Past Medical History:  Diagnosis Date  . Anemia   . Arthritis   . Cancer (HCC)    hx of thyroid cancer  . Colon polyps   . Depression   . Diabetes mellitus without complication ([place])   . Frequency   . GERD (gastroesophageal reflux disease)   . History of gout   . History of skin cancer    . Hyperlipidemia   . Hypertension   . Hypothyroidism   . Kidney cysts    followed by Dr. Diona Fanti  . 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 Jearld Fenton every 6 months x 2 yrs  . Nocturia   . PONV (postoperative nausea and vomiting)   . Rash    yeast infection under breasts    Essential (primary) hypertension, Type 2 diabetes mellitus w/o complications and CKD Stage 3 (GFR 30-59)   History reviewed. No pertinent family history.   Social History:   reports that she has never smoked. She has never used smokeless tobacco. She reports that she does not drink alcohol or use drugs.  Medications  Current Facility-Administered Medications:  .   stroke: mapping our early stages of recovery book, , Does not apply, Once, Barb Merino, MD .  acetaminophen (TYLENOL) tablet 650 mg, 650 mg, Oral, Q6H PRN **OR** acetaminophen (TYLENOL) suppository 650 mg, 650 mg, Rectal, Q6H PRN, Gonfa, Taye T, MD .  amLODipine (NORVASC) tablet 10 mg, 10 mg, Oral, q morning - 10a, Gonfa, Taye T, MD .  atenolol (TENORMIN) tablet 12.5 mg, 12.5 mg, Oral, BID, Gonfa, Taye T, MD .  insulin aspart (novoLOG) injection 0-9 Units, 0-9 Units, Subcutaneous, TID WC, Gonfa, Taye T, MD .  lactated ringers infusion, , Intravenous, Continuous, Gonfa, Taye T, MD, Last Rate: 75 mL/hr at 01/05/19 0054 .  levothyroxine (SYNTHROID, LEVOTHROID) tablet 125 mcg, 125 mcg, Oral, QHS, Gonfa, Taye T, MD .  linagliptin (TRADJENTA) tablet 5 mg,  5 mg, Oral, Daily, Gonfa, Taye T, MD .  ondansetron (ZOFRAN) tablet 4 mg, 4 mg, Oral, Q6H PRN **OR** ondansetron (ZOFRAN) injection 4 mg, 4 mg, Intravenous, Q6H PRN, Gonfa, Taye T, MD .  oxyCODONE (Oxy IR/ROXICODONE) immediate release tablet 5 mg, 5 mg, Oral, Q4H PRN, Gonfa, Taye T, MD .  pantoprazole (PROTONIX) injection 40 mg, 40 mg, Intravenous, Q12H, Gonfa, Taye T, MD, 40 mg at 01/05/19 0048 .  PARoxetine (PAXIL) tablet 10 mg, 10 mg, Oral, q morning - 10a, Gonfa, Taye T, MD .   polyethylene glycol (MIRALAX / GLYCOLAX) packet 17 g, 17 g, Oral, Daily PRN, Cyndia Skeeters, Taye T, MD .  traZODone (DESYREL) tablet 25 mg, 25 mg, Oral, QHS PRN, Cyndia Skeeters, Taye T, MD   Exam: Current vital signs: BP (!) 160/56 (BP Location: Left Arm)   Pulse 89   Temp 97.9 F (36.6 C) (Oral)   Resp 16   Ht 5\' 4"  (1.626 m)   Wt 63 kg   SpO2 90%   BMI 23.86 kg/m  Vital signs in last 24 hours: Temp:  [97.4 F (36.3 C)-98.5 F (36.9 C)] 97.9 F (36.6 C) (03/27 1018) Pulse Rate:  [79-97] 89 (03/27 1018) Resp:  [16-28] 16 (03/27 1018) BP: (146-170)/(56-88) 160/56 (03/27 1018) SpO2:  [90 %-97 %] 90 % (03/27 1018) Weight:  [78 kg-68 kg] 63 kg (03/27 0003)  GENERAL: Awake, alert in NAD HEENT: - Normocephalic and atraumatic, dry mm LUNGS - no increased work of breathing CV - equal pulses bilaterally. Ext: warm, well perfused, intact peripheral pulses, no LE edema  NEURO:  Mental Status: AA&Ox3  Language: speech is clear.  Naming, repetition, fluency, and comprehension intact. Cranial Nerves: PERRL, EOMI, visual fields full, flattening of right nasolabial fold, facial sensation intact, hearing intact, tongue/soft palate midline, normal sternocleidomastoid and trapezius muscle strength. No evidence of tongue atrophy or fibrillations Motor: Right - UE 5/5, LE 4-/5            Left - UE 4/5, LE 5/5 Tone: is normal and bulk is normal Sensation- Intact to light touch bilaterally though extinction on RLE and subjectively decreased in RUE Coordination: FTN intact bilaterally with mild dysmetria, mild right sided pronator drift  Labs I have reviewed labs in epic and the results pertinent to this consultation are: Persistent mild leukocytosis at 13.4, Hgb down to 10.7 which is new since 1 year ago. Renal function is stable. TSH low at 0.160. CK 580  CBC    Component Value Date/Time   WBC 13.4 (H) 01/05/2019 0524   RBC 3.73 (L) 01/05/2019 0524   HGB 10.7 (L) 01/05/2019 0524   HCT 32.3 (L)  01/05/2019 0524   PLT 268 01/05/2019 0524   MCV 86.6 01/05/2019 0524   MCH 28.7 01/05/2019 0524   MCHC 33.1 01/05/2019 0524   RDW 14.6 01/05/2019 0524   LYMPHSABS 0.6 (L) 01/04/2019 1742   MONOABS 1.0 01/04/2019 1742   EOSABS 0.1 01/04/2019 1742   BASOSABS 0.0 01/04/2019 1742    CMP     Component Value Date/Time   NA 136 01/05/2019 0524   K 3.4 (L) 01/05/2019 0524   CL 99 01/05/2019 0524   CO2 24 01/05/2019 0524   GLUCOSE 113 (H) 01/05/2019 0524   BUN 15 01/05/2019 0524   CREATININE 0.92 01/05/2019 0524   CALCIUM 8.9 01/05/2019 0524   PROT 7.4 01/04/2019 1742   ALBUMIN 3.5 01/04/2019 1742   AST 42 (H) 01/04/2019 1742   ALT 24 01/04/2019 1742  ALKPHOS 57 01/04/2019 1742   BILITOT 0.7 01/04/2019 1742   GFRNONAA 53 (L) 01/05/2019 0524   GFRAA >60 01/05/2019 0524    Lipid Panel     Component Value Date/Time   CHOL 102 01/05/2019 0524   TRIG 94 01/05/2019 0524   HDL 45 01/05/2019 0524   CHOLHDL 2.3 01/05/2019 0524   VLDL 19 01/05/2019 0524   LDLCALC 38 01/05/2019 0524    Imaging  CT-scan of the brain - Chronic atrophic and ischemic changes without acute abnormality  MRI examination of the brain 1. 6 mm acute hemorrhagic infarct involving the periventricular white matter of the posterior left corona radiata without significant mass effect. Associated trace layering intraventricular hemorrhage somewhat age indeterminate, but could be related to intraventricular extension. No hydrocephalus or ventricular trapping. 2. Additional 6 mm linear acute to early subacute ischemic nonhemorrhagic infarct involving the central medulla. 3. Additional 6 mm acute to early subacute ischemic nonhemorrhagic infarct involving the subcortical white matter of the anterior left frontal lobe. 4. Trace 2-3 mm subdural collection overlying the occipital convexity, likely blood products. Finding is age indeterminate, but could be acute to subacute in nature. No associated mass  effect. 5. Underlying age-related cerebral atrophy with moderate chronic microvascular ischemic disease.  CTA w/wo contast head and neck 1. Mild atherosclerotic disease carotid bifurcation bilaterally. No significant carotid artery or vertebral artery stenosis in the neck. 2. Negative for intracranial large vessel occlusion 3. Hypodensity central medulla compatible with small acute infarct as noted on MRI. Hypodensity left medial mid cerebellum likely artifact on CT. No acute infarct in this area on recent MRI.  Assessment: 85 y.o. female with PMH of HTN, T2DM, hypothyroidism and anemia presenting after being found down with findings of right nasolabial flattening, decreased sensation on right, and MRI revealing a 50mm acute hemorrhagic infarct of posterior left corona radiata w/o mass effect, along with two 49mm acute to subacute ischemic infarcts of central medulla and anterior left frontal lobe. CTA without large vessel occlusion.  Etiology likely arthrosclerotic in setting of risk factors, but cannot rule out embolic at this time.  Recommendations: -Telemetry monitoring -ok to start normalizing blood pressures as symptoms began at least a couple of days ago -Echocardiogram -HgbA1c, fasting lipid panel - A1c 6.2, LDL 38, HDL 45 -Frequent neuro checks -Prophylactic therapy-Antiplatelet med: hold for another couple of days in setting of hemorrhagic infarct with intraventricular extension, decision pending stroke team rounding  -Atorvastatin 80 mg PO daily -Risk factor modification -PT consult, OT consult, Speech consult -If Afib found on telemetry, will need anticoagulation. Decision pending imaging and stroke team rounding.  Please page stroke NP/PA/MD (listed on AMION)  from 8am-4 pm as this patient will be followed by the stroke team at this point.   Alphonzo Grieve, MD PGY3

## 2019-01-06 ENCOUNTER — Inpatient Hospital Stay (HOSPITAL_COMMUNITY): Payer: Medicare Other

## 2019-01-06 DIAGNOSIS — I639 Cerebral infarction, unspecified: Secondary | ICD-10-CM

## 2019-01-06 DIAGNOSIS — R471 Dysarthria and anarthria: Secondary | ICD-10-CM

## 2019-01-06 LAB — LIPID PANEL
CHOL/HDL RATIO: 2.5 ratio
Cholesterol: 114 mg/dL (ref 0–200)
HDL: 46 mg/dL (ref >40)
LDL Cholesterol: 48 mg/dL (ref 0–99)
Triglycerides: 99 mg/dL (ref <150)
VLDL: 20 mg/dL (ref 0–40)

## 2019-01-06 LAB — GLUCOSE, CAPILLARY
Glucose-Capillary: 118 mg/dL — ABNORMAL HIGH (ref 70–99)
Glucose-Capillary: 120 mg/dL — ABNORMAL HIGH (ref 70–99)
Glucose-Capillary: 127 mg/dL — ABNORMAL HIGH (ref 70–99)
Glucose-Capillary: 139 mg/dL — ABNORMAL HIGH (ref 70–99)

## 2019-01-06 MED ORDER — HALOPERIDOL LACTATE 5 MG/ML IJ SOLN
2.0000 mg | Freq: Once | INTRAMUSCULAR | Status: AC
  Administered 2019-01-06: 2 mg via INTRAVENOUS
  Filled 2019-01-06: qty 1

## 2019-01-06 MED ORDER — SIMVASTATIN 20 MG PO TABS
10.0000 mg | ORAL_TABLET | Freq: Every evening | ORAL | Status: DC
  Administered 2019-01-06 – 2019-01-07 (×2): 10 mg via ORAL
  Filled 2019-01-06 (×2): qty 1

## 2019-01-06 MED ORDER — ASPIRIN EC 81 MG PO TBEC
81.0000 mg | DELAYED_RELEASE_TABLET | Freq: Every day | ORAL | Status: DC
  Administered 2019-01-06 – 2019-01-07 (×2): 81 mg via ORAL
  Filled 2019-01-06 (×2): qty 1

## 2019-01-06 MED ORDER — RESOURCE THICKENUP CLEAR PO POWD
ORAL | Status: DC | PRN
  Filled 2019-01-06: qty 125

## 2019-01-06 NOTE — Progress Notes (Signed)
PROGRESS NOTE    Lauren Rose  LGX:[id number] DOB: [birth year] DOA: 01/04/2019 PCP: Derinda Late, MD    Brief Narrative:   85 year old lady who is pretty independent from home with history of anemia, hypertension, type 2 diabetes, arthritis, hypothyroidism, depression who was admitted after she was found in the bathroom by her daughter.  Patient said that she went to bathroom and lost her balance and fell on the floor and could not get up.  Patient states that the right leg gave up. Admitted from the emergency room with rhabdomyolysis, fall and leukocytosis.  Initial skeletal survey negative.  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  Lives alone. PT recommended Home PT. Needs re-evaluation before going home.  We suggest short term SNF/ rehab then decide about long term care like ALF or NH  Assessment & Plan:   Principal Problem:   Rhabdomyolysis Active Problems:   Essential hypertension   Diabetes ([place])   Hypothyroidism   Emesis   Unwitnessed fall   Acute ischemic stroke ([place])  Acute multiple vascular territory stroke: Some with hemorrhagic transformation. Continue to monitor as acute stroke in telemetry unit.  Neurochecks and vital signs as per stroke protocol. Patient was not a TPA and vascular intervention candidate because of being outside the window.  Last known normal before 6 PM. Patient had pased swallow testing at bedside, going for MBS today. continue medications and heart healthy diet. Antiplatelets, aspirin was given. Some stroke foci with hemorrhagic transformation's.  Neurology recommended to resume aspirin 325 mg/day on 01/07/2019. Multiple focus of stroke, also suspect embolic source.  Will refer to cardiology for event monitoring. on simvastatin 10 mg at home that we will continue. Blood pressures on goal.  Will continue home medications. Consultations, neurology, speech, PT OT MRI of the brain shows multiple vascular territory stroke. CTA of the  head and neck fairly stable. 2D echocardiogram normal. A1c 6.2.  LDL less than 70.  On simvastatin.  Mild rhabdomyolysis: Treated with IV fluids.  Improved.  Renal functions normal.  Borderline elevated troponins: Insignificant.  Type II non-STEMI.  No chest pain.  Leukocytosis: With no evidence of localizing infection.  Probably due to rhabdomyolysis.  We will continue to monitor.  Hypertension: As per #1.  Home medications resumed.  We will gradually control her blood pressure.  Type 2 diabetes: On oral hypoglycemics at home.  A1c 6.2.  Keep on sliding scale insulin.  Will resume oral hypoglycemics on discharge.  Emesis: Currently resolved.  Hypothyroidism: Clinically euthyroid.  TSH is 0.16.  Questionable suppression.  However cannot say with acute illness.  Continue same doses of thyroxine.  Will need to repeat in 1 month.  Call placed and discussed with patient's daughter.  Patient has been living independent. With acute a stroke and debility, she will need short-term placement at skilled nursing facility or acute inpatient rehab.  Her daughter is also looking for long-term care if needed after rehab.  DVT prophylaxis: SCDs. Code Status: DNR. Family Communication: Updated patient's daughter, Juliann Pulse over the phone. Disposition Plan: Acute inpatient rehab versus SNF.   Consultants:   Neurology.  Procedures:   None.  Antimicrobials:   None.   Subjective: Patient seen and examined.  No overnight events.  She has some coughing episodes with eating.  Voice is somehow clear today.  Objective: Vitals:   01/06/19 0520 01/06/19 0607 01/06/19 0854 01/06/19 1148  BP: (!) 143/67  (!) 156/74 (!) 161/68  Pulse: 74  92 89  Resp: 18  16 20  Temp: 98.6 F (37 C)  98.4 F (36.9 C) (!) 97.4 F (36.3 C)  TempSrc: Oral  Oral Oral  SpO2: 91%  92% 100%  Weight:  64.4 kg    Height:        Intake/Output Summary (Last 24 hours) at 01/06/2019 1323 Last data filed at 01/06/2019  0900 Gross per 24 hour  Intake 1115.7 ml  Output 350 ml  Net 765.7 ml   Filed Weights   01/04/19 1747 01/05/19 0003 01/06/19 0607  Weight: 68 kg 63 kg 64.4 kg    Examination:  General exam: Appears calm and comfortable.  On room air. Respiratory system: Clear to auscultation. Respiratory effort normal.  Occasional conducted airway sounds. Cardiovascular system: S1 & S2 heard, RRR. No JVD, murmurs, rubs, gallops or clicks. No pedal edema. Gastrointestinal system: Abdomen is nondistended, soft and nontender. No organomegaly or masses felt. Normal bowel sounds heard. Central nervous system: Alert and oriented. No focal neurological deficits. Skin: No rashes, lesions or ulcers Psychiatry: Judgement and insight appear normal. Mood & affect appropriate.  Neuro exam: Cranial nerves exam, Right facial droop present.  Dysarthria present. Upper extremity bilateral equal motor power and sensation. Lower extremity , right lower leg power 4/5.  Left normal. Gait and balance not checked.   Data Reviewed: I have personally reviewed following labs and imaging studies  CBC: Recent Labs  Lab 01/04/19 1742 01/04/19 2330 01/05/19 0524  WBC 19.0* 14.6* 13.4*  NEUTROABS 17.1*  --   --   HGB 12.6 12.0 10.7*  HCT 39.8 37.4 32.3*  MCV 87.9 87.4 86.6  PLT 319 311 710   Basic Metabolic Panel: Recent Labs  Lab 01/04/19 1742 01/04/19 2330 01/05/19 0524  NA 135  --  136  K 4.1  --  3.4*  CL 100  --  99  CO2 23  --  24  GLUCOSE 182*  --  113*  BUN 18  --  15  CREATININE 0.93 0.93 0.92  CALCIUM 9.6  --  8.9  MG 1.7  --   --    GFR: Estimated Creatinine Clearance: 32.3 mL/min (by C-G formula based on SCr of 0.92 mg/dL). Liver Function Tests: Recent Labs  Lab 01/04/19 1742  AST 42*  ALT 24  ALKPHOS 57  BILITOT 0.7  PROT 7.4  ALBUMIN 3.5   Recent Labs  Lab 01/04/19 1742  LIPASE 23   Recent Labs  Lab 01/04/19 2115  AMMONIA 12   Coagulation Profile: No results for  input(s): INR, PROTIME in the last 168 hours. Cardiac Enzymes: Recent Labs  Lab 01/04/19 1742 01/04/19 2115  CKTOTAL  --  580*  TROPONINI 0.06* 0.07*   BNP (last 3 results) No results for input(s): PROBNP in the last 8760 hours. HbA1C: Recent Labs    01/04/19 2330  HGBA1C 6.2*   CBG: Recent Labs  Lab 01/05/19 1243 01/05/19 1716 01/05/19 2127 01/06/19 0850 01/06/19 1221  GLUCAP 102* 97 121* 118* 120*   Lipid Profile: Recent Labs    01/05/19 0524 01/06/19 0321  CHOL 102 114  HDL 45 46  LDLCALC 38 48  TRIG 94 99  CHOLHDL 2.3 2.5   Thyroid Function Tests: Recent Labs    01/04/19 1749  TSH 0.160*   Anemia Panel: No results for input(s): VITAMINB12, FOLATE, FERRITIN, TIBC, IRON, RETICCTPCT in the last 72 hours. Sepsis Labs: No results for input(s): PROCALCITON, LATICACIDVEN in the last 168 hours.  Recent Results (from the past 240 hour(s))  Gram stain     Status: None   Collection Time: 01/04/19  7:38 PM  Result Value Ref Range Status   Specimen Description URINE, CLEAN CATCH  Final   Special Requests NONE  Final   Gram Stain   Final    WBC PRESENT, PREDOMINANTLY PMN GRAM NEGATIVE RODS GRAM POSITIVE COCCI CYTOSPIN SMEAR Performed at [facility], [address], [place], [place] [address]    Report Status 01/04/2019 FINAL  Final  Urine culture     Status: None   Collection Time: 01/04/19  7:38 PM  Result Value Ref Range Status   Specimen Description URINE, CLEAN CATCH  Final   Special Requests NONE  Final   Culture   Final    NO GROWTH Performed at [facility], [place] [address], [place], [place] [address]    Report Status 01/05/2019 FINAL  Final         Radiology Studies: Ct Angio Head W Or Wo Contrast  Result Date: 01/05/2019 CLINICAL DATA:  Cerebral hemorrhage suspected.  Stroke.  Fall. EXAM: CT ANGIOGRAPHY HEAD AND NECK TECHNIQUE: Multidetector CT imaging of the head and neck was performed using the standard protocol during  bolus administration of intravenous contrast. Multiplanar CT image reconstructions and MIPs were obtained to evaluate the vascular anatomy. Carotid stenosis measurements (when applicable) are obtained utilizing NASCET criteria, using the distal internal carotid diameter as the denominator. CONTRAST:  69mL OMNIPAQUE IOHEXOL 350 MG/ML SOLN COMPARISON:  CT head 01/04/2019, MRI 01/05/2019 FINDINGS: CTA NECK FINDINGS Aortic arch: Mild atherosclerotic calcification aortic arch without aneurysm or dissection. Proximal great vessels patent with mild atherosclerotic disease. Right carotid system: Mild atherosclerotic disease right carotid bifurcation without significant stenosis Left carotid system: Mild atherosclerotic disease left carotid bifurcation without significant stenosis. Vertebral arteries: Both vertebral arteries patent to the basilar without significant stenosis. Skeleton: Mild cervical spondylosis.  No acute skeletal abnormality. Other neck: Negative for mass or adenopathy. Upper chest: Lung apices clear bilaterally. Review of the MIP images confirms the above findings CTA HEAD FINDINGS Anterior circulation: Atherosclerotic calcification cavernous carotid bilaterally without significant stenosis. Anterior and middle cerebral arteries patent bilaterally without significant stenosis or occlusion. Posterior circulation: Both vertebral arteries patent to the basilar without stenosis. PICA patent bilaterally. Basilar widely patent. Right AICA patent. Superior cerebellar and posterior cerebral arteries patent bilaterally without significant stenosis. Fetal origin left posterior cerebral artery. Venous sinuses: Patent Anatomic variants: None Delayed phase: Normal enhancement postcontrast administration. Small 5 mm hyperdensity left parietal periventricular white matter unchanged consistent with a small area of acute hemorrhage. Hypodensity central medulla consistent with acute infarct. New area of hypodensity left  medial cerebellum may represent artifact versus interval infarction. Chronic ischemic changes in the white matter and pons. Review of the MIP images confirms the above findings IMPRESSION: 1. Mild atherosclerotic disease carotid bifurcation bilaterally. No significant carotid artery or vertebral artery stenosis in the neck. 2. Negative for intracranial large vessel occlusion 3. Hypodensity central medulla compatible with small acute infarct as noted on MRI. Hypodensity left medial mid cerebellum likely artifact on CT. No acute infarct in this area on recent MRI. Electronically Signed   By: Franchot Gallo M.D.   On: 01/05/2019 10:42   Ct Head Wo Contrast  Result Date: 01/04/2019 CLINICAL DATA:  Recent fall with pain, initial encounter EXAM: CT HEAD WITHOUT CONTRAST CT CERVICAL SPINE WITHOUT CONTRAST TECHNIQUE: Multidetector CT imaging of the head and cervical spine was performed following the standard protocol without intravenous contrast.  Multiplanar CT image reconstructions of the cervical spine were also generated. COMPARISON:  None. FINDINGS: CT HEAD FINDINGS Brain: Chronic atrophic and ischemic changes are identified. No findings to suggest acute hemorrhage, acute infarction or space-occupying mass lesion are noted. Vascular: No hyperdense vessel or unexpected calcification. Skull: Normal. Negative for fracture or focal lesion. Sinuses/Orbits: No acute finding. Other: None. CT CERVICAL SPINE FINDINGS Alignment: Within normal limits. Skull base and vertebrae: 7 cervical segments are well visualized. Vertebral body height is well maintained. Multilevel facet hypertrophic changes are seen. Mild osteophytic changes are noted as well. No acute fracture or acute facet abnormality is seen. Soft tissues and spinal canal: Surrounding soft tissue structures are within normal limits. Upper chest: Within normal limits. Other: None IMPRESSION: CT of the head: Chronic atrophic and ischemic changes without acute  abnormality. CT of cervical spine: Multilevel degenerative change without acute abnormality. Electronically Signed   By: Inez Catalina M.D.   On: 01/04/2019 18:53   Ct Angio Neck W Or Wo Contrast  Result Date: 01/05/2019 CLINICAL DATA:  Cerebral hemorrhage suspected.  Stroke.  Fall. EXAM: CT ANGIOGRAPHY HEAD AND NECK TECHNIQUE: Multidetector CT imaging of the head and neck was performed using the standard protocol during bolus administration of intravenous contrast. Multiplanar CT image reconstructions and MIPs were obtained to evaluate the vascular anatomy. Carotid stenosis measurements (when applicable) are obtained utilizing NASCET criteria, using the distal internal carotid diameter as the denominator. CONTRAST:  79mL OMNIPAQUE IOHEXOL 350 MG/ML SOLN COMPARISON:  CT head 01/04/2019, MRI 01/05/2019 FINDINGS: CTA NECK FINDINGS Aortic arch: Mild atherosclerotic calcification aortic arch without aneurysm or dissection. Proximal great vessels patent with mild atherosclerotic disease. Right carotid system: Mild atherosclerotic disease right carotid bifurcation without significant stenosis Left carotid system: Mild atherosclerotic disease left carotid bifurcation without significant stenosis. Vertebral arteries: Both vertebral arteries patent to the basilar without significant stenosis. Skeleton: Mild cervical spondylosis.  No acute skeletal abnormality. Other neck: Negative for mass or adenopathy. Upper chest: Lung apices clear bilaterally. Review of the MIP images confirms the above findings CTA HEAD FINDINGS Anterior circulation: Atherosclerotic calcification cavernous carotid bilaterally without significant stenosis. Anterior and middle cerebral arteries patent bilaterally without significant stenosis or occlusion. Posterior circulation: Both vertebral arteries patent to the basilar without stenosis. PICA patent bilaterally. Basilar widely patent. Right AICA patent. Superior cerebellar and posterior cerebral  arteries patent bilaterally without significant stenosis. Fetal origin left posterior cerebral artery. Venous sinuses: Patent Anatomic variants: None Delayed phase: Normal enhancement postcontrast administration. Small 5 mm hyperdensity left parietal periventricular white matter unchanged consistent with a small area of acute hemorrhage. Hypodensity central medulla consistent with acute infarct. New area of hypodensity left medial cerebellum may represent artifact versus interval infarction. Chronic ischemic changes in the white matter and pons. Review of the MIP images confirms the above findings IMPRESSION: 1. Mild atherosclerotic disease carotid bifurcation bilaterally. No significant carotid artery or vertebral artery stenosis in the neck. 2. Negative for intracranial large vessel occlusion 3. Hypodensity central medulla compatible with small acute infarct as noted on MRI. Hypodensity left medial mid cerebellum likely artifact on CT. No acute infarct in this area on recent MRI. Electronically Signed   By: Franchot Gallo M.D.   On: 01/05/2019 10:42   Ct Cervical Spine Wo Contrast  Result Date: 01/04/2019 CLINICAL DATA:  Recent fall with pain, initial encounter EXAM: CT HEAD WITHOUT CONTRAST CT CERVICAL SPINE WITHOUT CONTRAST TECHNIQUE: Multidetector CT imaging of the head and cervical spine was performed following the standard protocol  without intravenous contrast. Multiplanar CT image reconstructions of the cervical spine were also generated. COMPARISON:  None. FINDINGS: CT HEAD FINDINGS Brain: Chronic atrophic and ischemic changes are identified. No findings to suggest acute hemorrhage, acute infarction or space-occupying mass lesion are noted. Vascular: No hyperdense vessel or unexpected calcification. Skull: Normal. Negative for fracture or focal lesion. Sinuses/Orbits: No acute finding. Other: None. CT CERVICAL SPINE FINDINGS Alignment: Within normal limits. Skull base and vertebrae: 7 cervical segments  are well visualized. Vertebral body height is well maintained. Multilevel facet hypertrophic changes are seen. Mild osteophytic changes are noted as well. No acute fracture or acute facet abnormality is seen. Soft tissues and spinal canal: Surrounding soft tissue structures are within normal limits. Upper chest: Within normal limits. Other: None IMPRESSION: CT of the head: Chronic atrophic and ischemic changes without acute abnormality. CT of cervical spine: Multilevel degenerative change without acute abnormality. Electronically Signed   By: Inez Catalina M.D.   On: 01/04/2019 18:53   Mr Brain Wo Contrast  Addendum Date: 01/05/2019   ADDENDUM REPORT: 01/05/2019 04:42 ADDENDUM: Findings were communicated by telephone to the covering nurse practitioner [place] at 3:43 a.m. on 01/05/2019. Electronically Signed   By: Jeannine Boga M.D.   On: 01/05/2019 04:42   Result Date: 01/05/2019 CLINICAL DATA:  Initial evaluation for acute TIA, dysarthria. EXAM: MRI HEAD WITHOUT CONTRAST TECHNIQUE: Multiplanar, multiecho pulse sequences of the brain and surrounding structures were obtained without intravenous contrast. COMPARISON:  Prior CT from earlier the same day. FINDINGS: Brain: Diffuse prominence of the CSF containing spaces compatible with generalized age-related cerebral atrophy. Changes are most prominent at the anterior temporal lobes bilaterally. Patchy and confluent T2/FLAIR hyperintensity within the periventricular and deep white matter both cerebral hemispheres most consistent with chronic microvascular ischemic disease. Chronic microvascular ischemic changes present within the pons as well. 6 mm focus of diffusion abnormality seen within the posterior left corona radiata, consistent with a small acute a infarct (series 5, image 59). Associated susceptibility artifact, consistent with hemorrhage. Small focus of hemorrhage seen within this region on prior CT as well. Layering susceptibility artifact within  the occipital horns of the lateral ventricles, left greater than right, suggestive of intraventricular extension, age indeterminate, but could be acute to subacute in nature. No hydrocephalus or ventricular trapping. Additional 5 mm focus of diffusion abnormality involving the subcortical white matter of the high anterior left frontal lobe consistent with an acute to subacute ischemic infarct (series 5, image 67). Additionally, there is a 6 mm focus of linear diffusion abnormality involving the central aspect of the medulla (series 5, image 43). Associated signal loss on ADC map (series 6, image 6). Finding consistent with a small acute to early subacute ischemic infarct. No other evidence for acute or subacute ischemia. Gray-white matter differentiation otherwise maintained. No other areas of remote cortical infarction. No mass lesion, midline shift or mass effect. Trace extra-axial collection measuring 2-3 mm in thickness seen overlying the left occipital convexity, likely a small subdural hematoma, age indeterminate, but could be subacute in nature. This is not well visualized on prior CT. Vascular: Major intracranial vascular flow voids are maintained. Skull and upper cervical spine: Craniocervical junction within normal limits. Upper cervical spine normal. Bone marrow signal intensity within normal limits. Hyperostosis frontalis interna noted. No scalp soft tissue abnormality. Sinuses/Orbits: Globes and orbital soft tissues demonstrate no acute finding. Patient status post bilateral ocular lens replacement. Paranasal sinuses are clear. No mastoid effusion. Inner ear structures grossly normal. Other: None.  IMPRESSION: 1. 6 mm acute hemorrhagic infarct involving the periventricular white matter of the posterior left corona radiata without significant mass effect. Associated trace layering intraventricular hemorrhage somewhat age indeterminate, but could be related to intraventricular extension. No hydrocephalus  or ventricular trapping. 2. Additional 6 mm linear acute to early subacute ischemic nonhemorrhagic infarct involving the central medulla. 3. Additional 6 mm acute to early subacute ischemic nonhemorrhagic infarct involving the subcortical white matter of the anterior left frontal lobe. 4. Trace 2-3 mm subdural collection overlying the occipital convexity, likely blood products. Finding is age indeterminate, but could be acute to subacute in nature. No associated mass effect. 5. Underlying age-related cerebral atrophy with moderate chronic microvascular ischemic disease. A call is in to the ordering clinician. Findings will be communicated as soon as possible. Electronically Signed: By: Jeannine Boga M.D. On: 01/05/2019 03:33   Dg Pelvis Portable  Result Date: 01/04/2019 CLINICAL DATA:  Recent fall with pelvic pain, initial encounter EXAM: PORTABLE PELVIS 1-2 VIEWS COMPARISON:  None. FINDINGS: Left hip replacement is noted. The pelvic ring is intact. Degenerative changes of the lumbar spine are noted. No acute fracture is seen. Postsurgical changes are noted. IMPRESSION: No acute abnormality noted. Electronically Signed   By: Inez Catalina M.D.   On: 01/04/2019 18:29   Dg Chest Portable 1 View  Result Date: 01/04/2019 CLINICAL DATA:  Recent fall with chest pain, initial encounter EXAM: PORTABLE CHEST 1 VIEW COMPARISON:  01/06/18 FINDINGS: Cardiac shadow is within normal limits and stable. Mild aortic calcifications are again seen. The lungs are well aerated bilaterally. No acute bony abnormality is noted. IMPRESSION: No active disease. Electronically Signed   By: Inez Catalina M.D.   On: 01/04/2019 18:27   Dg Shoulder Left  Result Date: 01/04/2019 CLINICAL DATA:  Recent fall with left shoulder pain, initial encounter EXAM: LEFT SHOULDER - 2+ VIEW COMPARISON:  None. FINDINGS: Degenerative changes of the acromioclavicular joint are noted. No dislocation or fracture is seen. The underlying rib cage is  within normal limits. IMPRESSION: No acute abnormality noted. Electronically Signed   By: Inez Catalina M.D.   On: 01/04/2019 18:28        Scheduled Meds:  amLODipine  10 mg Oral q morning - 10a   aspirin EC  81 mg Oral Daily   insulin aspart  0-9 Units Subcutaneous TID WC   levothyroxine  125 mcg Oral QHS   linagliptin  5 mg Oral Daily   pantoprazole  40 mg Oral BID   PARoxetine  10 mg Oral q morning - 10a   simvastatin  10 mg Oral QPM   Continuous Infusions:    LOS: 2 days    Time spent: 25 minutes.    Barb Merino, MD [facility] Pager [phone]  If 7PM-7AM, please contact night-coverage [email or web address] Password [email or web address] 01/06/2019, 1:23 PM

## 2019-01-06 NOTE — Progress Notes (Signed)
*  PRELIMINARY RESULTS* Vascular Ultrasound Bilateral lower extremity venous duplex has been completed.  Please see CV Proc tab for preliminary results.  Everrett Coombe 01/06/2019, 4:41 PM

## 2019-01-06 NOTE — Progress Notes (Signed)
PT Cancellation Note  Patient Details Name: Lauren Rose MRN: [id number] DOB: [birth year]   Cancelled Treatment:    Reason Eval/Treat Not Completed: Patient at procedure or test/unavailable. Pt being transported for MBS at this time. PT will continue to f/u with pt acutely as available.    Clearnce Sorrel Maila Dukes 01/06/2019, 12:47 PM

## 2019-01-06 NOTE — Progress Notes (Signed)
Modified Barium Swallow Progress Note  Patient Details  Name: TAJIA SZELIGA MRN: [id number] Date of Birth: 1922/04/21  Today's Date: 01/06/2019  Modified Barium Swallow completed.  Full report located under Chart Review in the Imaging Section.  Brief recommendations include the following:  Clinical Impression  Patient presents with a mild oral and a mod-severe pharyngeal dysphagia with signifcantly reduced sensation leading to swallow initiation delays to level of vallecula with puree and regular solids and honey and nectar liquids, and swallow initiation delays to level of pyriform sinus with thin liquids. Patient exhibited mild oral transit delays, but was able to masticate hard solid and transit barium pill/tablet without significant difficulty. Patient aspirated before the swallow with thin liquids and during the swallow with nectar thick liquids and although she did exhibit a cough response, it was not effective to clear aspirate. Patient did not exhibit any penetration or aspiration of honey thick liquids(on MBS imaging slides, at the end of study, SLP mislabeled a nectar liquid trial with 'H' for honey thick liquids). Patient did exhibit vallecular and pyriform sinus residuals with all tested consistencies (minimal for thin liquids, mild-moderate for honey thick, puree, regular solids) but did not exhibit any aspiration after the swallow with these residuals, though cannot r/o risk from this type of event occuring. Chin tuck was not effective to prevent penetration or aspiration with thin liquids and inconsistently effective with nectar thick liquids.    Swallow Evaluation Recommendations       SLP Diet Recommendations: Regular solids;Honey thick liquids   Liquid Administration via: Cup   Medication Administration: Whole meds with puree   Supervision: Patient able to self feed   Compensations: Slow rate;Small sips/bites   Postural Changes: Seated upright at 90 degrees   Oral Care  Recommendations: Oral care BID   Other Recommendations: Prohibited food (jello, ice cream, thin soups);Order thickener from pharmacy;Clarify dietary restrictions;Remove water pitcher     Sonia Baller, MA, [facility]

## 2019-01-06 NOTE — Progress Notes (Signed)
Assisted patient with calling daughter Cleda Daub at 2157.   IV access lost (IV leaking) at 0015 and LR continuous fluids stopped. Patient stated she wanted to sleep and requested sleep aid (PRN Trazodone given).  Informed NP Blount via phone of lost IV access, that patient's fluids were stopped and will start new IV at 0400 or 0500 since patient expressed desire to sleep and indicated dissatisfaction with having to get a new IV at this time.   Notified by tele at 0509 of patient 11 beat run SVT, HR 141. Pt. assessed, not symptomatic, BP 143/67, HR 74. NP Blount notified at [place].

## 2019-01-06 NOTE — Progress Notes (Addendum)
STROKE TEAM PROGRESS NOTE   SUBJECTIVE (INTERVAL HISTORY) Her nurse tech is at the bedside.  Pt is lying in bed, orientated to place, time and self. Can not remember what happened yesterday and why she was brought over to hospital. Still has right facial droop but no motor deficit. Mild dysarthria.     OBJECTIVE Vitals:   01/05/19 2316 01/06/19 0100 01/06/19 0520 01/06/19 0607  BP: (!) 141/52 (!) 135/51 (!) 143/67   Pulse: 89 72 74   Resp:  18 18   Temp: 98.2 F (36.8 C) 98.2 F (36.8 C) 98.6 F (37 C)   TempSrc: Oral Oral Oral   SpO2: 94% 92% 91%   Weight:    64.4 kg  Height:        CBC:  Recent Labs  Lab 01/04/19 1742 01/04/19 2330 01/05/19 0524  WBC 19.0* 14.6* 13.4*  NEUTROABS 17.1*  --   --   HGB 12.6 12.0 10.7*  HCT 39.8 37.4 32.3*  MCV 87.9 87.4 86.6  PLT 319 311 528    Basic Metabolic Panel:  Recent Labs  Lab 01/04/19 1742 01/04/19 2330 01/05/19 0524  NA 135  --  136  K 4.1  --  3.4*  CL 100  --  99  CO2 23  --  24  GLUCOSE 182*  --  113*  BUN 18  --  15  CREATININE 0.93 0.93 0.92  CALCIUM 9.6  --  8.9  MG 1.7  --   --     Lipid Panel:     Component Value Date/Time   CHOL 114 01/06/2019 0321   TRIG 99 01/06/2019 0321   HDL 46 01/06/2019 0321   CHOLHDL 2.5 01/06/2019 0321   VLDL 20 01/06/2019 0321   LDLCALC 48 01/06/2019 0321   HgbA1c:  Lab Results  Component Value Date   HGBA1C 6.2 (H) 01/04/2019   Urine Drug Screen: No results found for: LABOPIA, COCAINSCRNUR, LABBENZ, AMPHETMU, THCU, LABBARB  Alcohol Level No results found for: ETH  IMAGING   Ct Angio Head W Or Wo Contrast  Result Date: 01/05/2019 CLINICAL DATA:  Cerebral hemorrhage suspected.  Stroke.  Fall. EXAM: CT ANGIOGRAPHY HEAD AND NECK TECHNIQUE: Multidetector CT imaging of the head and neck was performed using the standard protocol during bolus administration of intravenous contrast. Multiplanar CT image reconstructions and MIPs were obtained to evaluate the vascular  anatomy. Carotid stenosis measurements (when applicable) are obtained utilizing NASCET criteria, using the distal internal carotid diameter as the denominator. CONTRAST:  67mL OMNIPAQUE IOHEXOL 350 MG/ML SOLN COMPARISON:  CT head 01/04/2019, MRI 01/05/2019 FINDINGS: CTA NECK FINDINGS Aortic arch: Mild atherosclerotic calcification aortic arch without aneurysm or dissection. Proximal great vessels patent with mild atherosclerotic disease. Right carotid system: Mild atherosclerotic disease right carotid bifurcation without significant stenosis Left carotid system: Mild atherosclerotic disease left carotid bifurcation without significant stenosis. Vertebral arteries: Both vertebral arteries patent to the basilar without significant stenosis. Skeleton: Mild cervical spondylosis.  No acute skeletal abnormality. Other neck: Negative for mass or adenopathy. Upper chest: Lung apices clear bilaterally. Review of the MIP images confirms the above findings CTA HEAD FINDINGS Anterior circulation: Atherosclerotic calcification cavernous carotid bilaterally without significant stenosis. Anterior and middle cerebral arteries patent bilaterally without significant stenosis or occlusion. Posterior circulation: Both vertebral arteries patent to the basilar without stenosis. PICA patent bilaterally. Basilar widely patent. Right AICA patent. Superior cerebellar and posterior cerebral arteries patent bilaterally without significant stenosis. Fetal origin left posterior cerebral artery. Venous sinuses:  Patent Anatomic variants: None Delayed phase: Normal enhancement postcontrast administration. Small 5 mm hyperdensity left parietal periventricular white matter unchanged consistent with a small area of acute hemorrhage. Hypodensity central medulla consistent with acute infarct. New area of hypodensity left medial cerebellum may represent artifact versus interval infarction. Chronic ischemic changes in the white matter and pons. Review of  the MIP images confirms the above findings IMPRESSION: 1. Mild atherosclerotic disease carotid bifurcation bilaterally. No significant carotid artery or vertebral artery stenosis in the neck. 2. Negative for intracranial large vessel occlusion 3. Hypodensity central medulla compatible with small acute infarct as noted on MRI. Hypodensity left medial mid cerebellum likely artifact on CT. No acute infarct in this area on recent MRI. Electronically Signed   By: Franchot Gallo M.D.   On: 01/05/2019 10:42   Ct Head Wo Contrast  Result Date: 01/04/2019 CLINICAL DATA:  Recent fall with pain, initial encounter EXAM: CT HEAD WITHOUT CONTRAST CT CERVICAL SPINE WITHOUT CONTRAST TECHNIQUE: Multidetector CT imaging of the head and cervical spine was performed following the standard protocol without intravenous contrast. Multiplanar CT image reconstructions of the cervical spine were also generated. COMPARISON:  None. FINDINGS: CT HEAD FINDINGS Brain: Chronic atrophic and ischemic changes are identified. No findings to suggest acute hemorrhage, acute infarction or space-occupying mass lesion are noted. Vascular: No hyperdense vessel or unexpected calcification. Skull: Normal. Negative for fracture or focal lesion. Sinuses/Orbits: No acute finding. Other: None. CT CERVICAL SPINE FINDINGS Alignment: Within normal limits. Skull base and vertebrae: 7 cervical segments are well visualized. Vertebral body height is well maintained. Multilevel facet hypertrophic changes are seen. Mild osteophytic changes are noted as well. No acute fracture or acute facet abnormality is seen. Soft tissues and spinal canal: Surrounding soft tissue structures are within normal limits. Upper chest: Within normal limits. Other: None IMPRESSION: CT of the head: Chronic atrophic and ischemic changes without acute abnormality. CT of cervical spine: Multilevel degenerative change without acute abnormality. Electronically Signed   By: Inez Catalina M.D.   On:  01/04/2019 18:53   Ct Angio Neck W Or Wo Contrast  Result Date: 01/05/2019 CLINICAL DATA:  Cerebral hemorrhage suspected.  Stroke.  Fall. EXAM: CT ANGIOGRAPHY HEAD AND NECK TECHNIQUE: Multidetector CT imaging of the head and neck was performed using the standard protocol during bolus administration of intravenous contrast. Multiplanar CT image reconstructions and MIPs were obtained to evaluate the vascular anatomy. Carotid stenosis measurements (when applicable) are obtained utilizing NASCET criteria, using the distal internal carotid diameter as the denominator. CONTRAST:  79mL OMNIPAQUE IOHEXOL 350 MG/ML SOLN COMPARISON:  CT head 01/04/2019, MRI 01/05/2019 FINDINGS: CTA NECK FINDINGS Aortic arch: Mild atherosclerotic calcification aortic arch without aneurysm or dissection. Proximal great vessels patent with mild atherosclerotic disease. Right carotid system: Mild atherosclerotic disease right carotid bifurcation without significant stenosis Left carotid system: Mild atherosclerotic disease left carotid bifurcation without significant stenosis. Vertebral arteries: Both vertebral arteries patent to the basilar without significant stenosis. Skeleton: Mild cervical spondylosis.  No acute skeletal abnormality. Other neck: Negative for mass or adenopathy. Upper chest: Lung apices clear bilaterally. Review of the MIP images confirms the above findings CTA HEAD FINDINGS Anterior circulation: Atherosclerotic calcification cavernous carotid bilaterally without significant stenosis. Anterior and middle cerebral arteries patent bilaterally without significant stenosis or occlusion. Posterior circulation: Both vertebral arteries patent to the basilar without stenosis. PICA patent bilaterally. Basilar widely patent. Right AICA patent. Superior cerebellar and posterior cerebral arteries patent bilaterally without significant stenosis. Fetal origin left posterior cerebral artery.  Venous sinuses: Patent Anatomic variants:  None Delayed phase: Normal enhancement postcontrast administration. Small 5 mm hyperdensity left parietal periventricular white matter unchanged consistent with a small area of acute hemorrhage. Hypodensity central medulla consistent with acute infarct. New area of hypodensity left medial cerebellum may represent artifact versus interval infarction. Chronic ischemic changes in the white matter and pons. Review of the MIP images confirms the above findings IMPRESSION: 1. Mild atherosclerotic disease carotid bifurcation bilaterally. No significant carotid artery or vertebral artery stenosis in the neck. 2. Negative for intracranial large vessel occlusion 3. Hypodensity central medulla compatible with small acute infarct as noted on MRI. Hypodensity left medial mid cerebellum likely artifact on CT. No acute infarct in this area on recent MRI. Electronically Signed   By: Franchot Gallo M.D.   On: 01/05/2019 10:42   Ct Cervical Spine Wo Contrast  Result Date: 01/04/2019 CLINICAL DATA:  Recent fall with pain, initial encounter EXAM: CT HEAD WITHOUT CONTRAST CT CERVICAL SPINE WITHOUT CONTRAST TECHNIQUE: Multidetector CT imaging of the head and cervical spine was performed following the standard protocol without intravenous contrast. Multiplanar CT image reconstructions of the cervical spine were also generated. COMPARISON:  None. FINDINGS: CT HEAD FINDINGS Brain: Chronic atrophic and ischemic changes are identified. No findings to suggest acute hemorrhage, acute infarction or space-occupying mass lesion are noted. Vascular: No hyperdense vessel or unexpected calcification. Skull: Normal. Negative for fracture or focal lesion. Sinuses/Orbits: No acute finding. Other: None. CT CERVICAL SPINE FINDINGS Alignment: Within normal limits. Skull base and vertebrae: 7 cervical segments are well visualized. Vertebral body height is well maintained. Multilevel facet hypertrophic changes are seen. Mild osteophytic changes are noted  as well. No acute fracture or acute facet abnormality is seen. Soft tissues and spinal canal: Surrounding soft tissue structures are within normal limits. Upper chest: Within normal limits. Other: None IMPRESSION: CT of the head: Chronic atrophic and ischemic changes without acute abnormality. CT of cervical spine: Multilevel degenerative change without acute abnormality. Electronically Signed   By: Inez Catalina M.D.   On: 01/04/2019 18:53   Mr Brain Wo Contrast  Addendum Date: 01/05/2019   ADDENDUM REPORT: 01/05/2019 04:42 ADDENDUM: Findings were communicated by telephone to the covering nurse practitioner Blount at 3:43 a.m. on 01/05/2019. Electronically Signed   By: Jeannine Boga M.D.   On: 01/05/2019 04:42   Result Date: 01/05/2019 CLINICAL DATA:  Initial evaluation for acute TIA, dysarthria. EXAM: MRI HEAD WITHOUT CONTRAST TECHNIQUE: Multiplanar, multiecho pulse sequences of the brain and surrounding structures were obtained without intravenous contrast. COMPARISON:  Prior CT from earlier the same day. FINDINGS: Brain: Diffuse prominence of the CSF containing spaces compatible with generalized age-related cerebral atrophy. Changes are most prominent at the anterior temporal lobes bilaterally. Patchy and confluent T2/FLAIR hyperintensity within the periventricular and deep white matter both cerebral hemispheres most consistent with chronic microvascular ischemic disease. Chronic microvascular ischemic changes present within the pons as well. 6 mm focus of diffusion abnormality seen within the posterior left corona radiata, consistent with a small acute a infarct (series 5, image 59). Associated susceptibility artifact, consistent with hemorrhage. Small focus of hemorrhage seen within this region on prior CT as well. Layering susceptibility artifact within the occipital horns of the lateral ventricles, left greater than right, suggestive of intraventricular extension, age indeterminate, but could be  acute to subacute in nature. No hydrocephalus or ventricular trapping. Additional 5 mm focus of diffusion abnormality involving the subcortical white matter of the high anterior left frontal lobe consistent  with an acute to subacute ischemic infarct (series 5, image 67). Additionally, there is a 6 mm focus of linear diffusion abnormality involving the central aspect of the medulla (series 5, image 43). Associated signal loss on ADC map (series 6, image 6). Finding consistent with a small acute to early subacute ischemic infarct. No other evidence for acute or subacute ischemia. Gray-white matter differentiation otherwise maintained. No other areas of remote cortical infarction. No mass lesion, midline shift or mass effect. Trace extra-axial collection measuring 2-3 mm in thickness seen overlying the left occipital convexity, likely a small subdural hematoma, age indeterminate, but could be subacute in nature. This is not well visualized on prior CT. Vascular: Major intracranial vascular flow voids are maintained. Skull and upper cervical spine: Craniocervical junction within normal limits. Upper cervical spine normal. Bone marrow signal intensity within normal limits. Hyperostosis frontalis interna noted. No scalp soft tissue abnormality. Sinuses/Orbits: Globes and orbital soft tissues demonstrate no acute finding. Patient status post bilateral ocular lens replacement. Paranasal sinuses are clear. No mastoid effusion. Inner ear structures grossly normal. Other: None. IMPRESSION: 1. 6 mm acute hemorrhagic infarct involving the periventricular white matter of the posterior left corona radiata without significant mass effect. Associated trace layering intraventricular hemorrhage somewhat age indeterminate, but could be related to intraventricular extension. No hydrocephalus or ventricular trapping. 2. Additional 6 mm linear acute to early subacute ischemic nonhemorrhagic infarct involving the central medulla. 3.  Additional 6 mm acute to early subacute ischemic nonhemorrhagic infarct involving the subcortical white matter of the anterior left frontal lobe. 4. Trace 2-3 mm subdural collection overlying the occipital convexity, likely blood products. Finding is age indeterminate, but could be acute to subacute in nature. No associated mass effect. 5. Underlying age-related cerebral atrophy with moderate chronic microvascular ischemic disease. A call is in to the ordering clinician. Findings will be communicated as soon as possible. Electronically Signed: By: Jeannine Boga M.D. On: 01/05/2019 03:33    PHYSICAL EXAM  Temp:  [97.4 F (36.3 C)-98.6 F (37 C)] 97.4 F (36.3 C) (03/28 1148) Pulse Rate:  [72-92] 89 (03/28 1148) Resp:  [16-20] 20 (03/28 1148) BP: (125-172)/(51-74) 161/68 (03/28 1148) SpO2:  [91 %-100 %] 100 % (03/28 1148) Weight:  [64.4 kg] 64.4 kg (03/28 0607)  General - Well nourished, well developed, in no apparent distress.  Ophthalmologic - fundi not visualized due to noncooperation.  Cardiovascular - Regular rate and rhythm.  Mental Status -  Level of arousal and orientation to time, place, and person were intact, not orientated to name of hospital, or situation.  Language including expression, repetition, comprehension was assessed and found intact. Naming 2/4  Cranial Nerves II - XII - II - Visual field intact OU. III, IV, VI - Extraocular movements intact. V - Facial sensation intact bilaterally. VII - right facial droop. VIII - Hearing & vestibular intact bilaterally. X - Palate elevates symmetrically, mild dysarthria. XI - Chin turning & shoulder shrug intact bilaterally. XII - Tongue protrusion intact.  Motor Strength - The patient's strength was symmetrical in all extremities and pronator drift was absent.  Bulk was normal and fasciculations were absent.   Motor Tone - Muscle tone was assessed at the neck and appendages and was normal.  Reflexes - The patient's  reflexes were symmetrical in all extremities and she had no pathological reflexes.  Sensory - Light touch, temperature/pinprick were assessed and were symmetrical.    Coordination - The patient had normal movements in the hands with no ataxia  or dysmetria.  Tremor was absent.  Gait and Station - deferred.   ASSESSMENT/PLAN Ms. Lauren Rose is a 85 y.o. female with history of HTN, DM presenting with found down at home with N/V. She did not receive IV t-PA due to outside window.   Stroke: multifocal including central medullar, left CR with hemorrhagic conversion, and punctate left ACA, embolic pattern, source unclear but concerning for occult afib  Resultant  Right facial droop  CT head no acute abnormality  MRI head - multifocal including central medullar, left CR with hemorrhagic conversion, and punctate left ACA  CTA H&N unremarkable  2D Echo EF > 65%  LE venous doppler negative for DVT  Will need 30 day cardiac event monitoring as outpt to rule out afib  LDL - 38  HgbA1c - 6.2  VTE prophylaxis - SCDs  On diet  aspirin 325 mg daily prior to admission, now on aspirin 81 mg daily due to hemorrhagic conversion. Resume ASA 325mg  in 3 days.   Patient counseled to be compliant with her antithrombotic medications  Ongoing aggressive stroke risk factor management  Therapy recommendations:  Home health PT  Disposition:  Pending  Hypertension  Stable . Long-term BP goal normotensive  Hyperlipidemia  Lipid lowering medication PTA:  zocor 10  LDL 38, goal < 70  Current lipid lowering medication: zocor 10  Continue statin at discharge  Diabetes  HgbA1c 6.2, goal < 7.0  Controlled  SSI  CBG monitoring  Other Stroke Risk Factors  Advanced age  Other Active Problems  UA WBC 11-20, UA culture negative   Hospital day # 2  Neurology will sign off. Please call with questions. Pt will follow up with stroke clinic NP at [facility] in about 4 weeks. Thanks for the  consult.  Rosalin Hawking, MD PhD Stroke Neurology 01/06/2019 12:23 PM   To contact Stroke Continuity provider, please refer to [email or web address] After hours, contact General Neurology

## 2019-01-07 LAB — GLUCOSE, CAPILLARY
Glucose-Capillary: 103 mg/dL — ABNORMAL HIGH (ref 70–99)
Glucose-Capillary: 134 mg/dL — ABNORMAL HIGH (ref 70–99)
Glucose-Capillary: 164 mg/dL — ABNORMAL HIGH (ref 70–99)
Glucose-Capillary: 110 mg/dL — ABNORMAL HIGH (ref 70–99)
Glucose-Capillary: 185 mg/dL — ABNORMAL HIGH (ref 70–99)

## 2019-01-07 LAB — POTASSIUM: Potassium: 3.7 mmol/L (ref 3.5–5.1)

## 2019-01-07 LAB — TSH: TSH: 0.736 u[IU]/mL (ref 0.350–4.500)

## 2019-01-07 LAB — T4, FREE: Free T4: 1.22 ng/dL (ref 0.82–1.77)

## 2019-01-07 MED ORDER — ASPIRIN EC 325 MG PO TBEC
325.0000 mg | DELAYED_RELEASE_TABLET | Freq: Every day | ORAL | Status: DC
  Administered 2019-01-08: 325 mg via ORAL
  Filled 2019-01-07: qty 1

## 2019-01-07 NOTE — Progress Notes (Signed)
Physical Therapy Treatment Patient Details Name: Lauren Rose MRN: [id number] DOB: [birth year] Today's Date: 01/07/2019    History of Present Illness 85 year old lady who is independent from home with history of anemia, hypertension, type 2 diabetes, arthritis, hypothyroidism, depression who was admitted last night after she was found in the bathroom by her daughter. Admitted from the emergency room with rhabdomyolysis, fall and leukocytosis.  Initial skeletal survey negative.  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    PT Comments    Pt pleasantly confused on arrival oriented to self and year wanting to know "who is doing this". Pt able to get OOB with min assist however incontinent with all movement with max assist for pericare. Pt lives alone, manages her own medication and only has intermittent help during the day per pt and chart. PT requires assist and guarding for all mobility and SNF is a better option unless family can provide 24hr care. Will continue to follow and recommend mobility with nursing daily.    Follow Up Recommendations  SNF;Supervision/Assistance - 24 hour     Equipment Recommendations  Rolling walker with 5" wheels    Recommendations for Other Services       Precautions / Restrictions Precautions Precautions: Fall Precaution Comments: incontinent    Mobility  Bed Mobility Overal bed mobility: Needs Assistance Bed Mobility: Supine to Sit     Supine to sit: Min assist;HOB elevated     General bed mobility comments: HOB 20 degrees with min assist to lift trunk from surface to sitting  Transfers Overall transfer level: Needs assistance   Transfers: Sit to/from Stand;Stand Pivot Transfers Sit to Stand: Min guard Stand pivot transfers: Min guard       General transfer comment: cues for hand placement with guarding for safety to stand from bed pivot to [facility] and stand from [facility] to walk  Ambulation/Gait Ambulation/Gait assistance: Min  guard Gait Distance (Feet): 100 Feet Assistive device: Rolling walker (2 wheeled) Gait Pattern/deviations: Step-through pattern;Trunk flexed;Decreased stride length   Gait velocity interpretation: >2.62 ft/sec, indicative of community ambulatory General Gait Details: slow slightly unsteady gait with reliance on RW and guarding for safety and direction   Stairs             Wheelchair Mobility    Modified Rankin (Stroke Patients Only) Modified Rankin (Stroke Patients Only) Pre-Morbid Rankin Score: No symptoms Modified Rankin: Moderately severe disability     Balance Overall balance assessment: Needs assistance Sitting-balance support: Feet supported;No upper extremity supported Sitting balance-Leahy Scale: Fair     Standing balance support: Bilateral upper extremity supported;During functional activity Standing balance-Leahy Scale: Poor                              Cognition Arousal/Alertness: Awake/alert Behavior During Therapy: WFL for tasks assessed/performed Overall Cognitive Status: Impaired/Different from baseline Area of Impairment: Orientation;Attention;Memory;Problem solving;Safety/judgement                 Orientation Level: Disoriented to;Time;Situation;Place Current Attention Level: Sustained Memory: Decreased short-term memory   Safety/Judgement: Decreased awareness of safety;Decreased awareness of deficits   Problem Solving: Slow processing General Comments: pt asking several times "who is doing this to me". Pt oriented to year only and unable to recall situation after education. Pt lives alone and manages her own medication      Exercises General Exercises - Lower Extremity Long Arc Quad: AROM;15 reps;Seated;Both Hip Flexion/Marching: AROM;15 reps;Seated;Both    General  Comments        Pertinent Vitals/Pain Pain Assessment: No/denies pain    Home Living                      Prior Function            PT  Goals (current goals can now be found in the care plan section) Progress towards PT goals: Progressing toward goals    Frequency    Min 3X/week      PT Plan Discharge plan needs to be updated    Co-evaluation              AM-PAC PT "6 Clicks" Mobility   Outcome Measure  Help needed turning from your back to your side while in a flat bed without using bedrails?: A Little Help needed moving from lying on your back to sitting on the side of a flat bed without using bedrails?: A Little Help needed moving to and from a bed to a chair (including a wheelchair)?: A Little Help needed standing up from a chair using your arms (e.g., wheelchair or bedside chair)?: A Little Help needed to walk in hospital room?: A Little Help needed climbing 3-5 steps with a railing? : A Lot 6 Click Score: 17    End of Session Equipment Utilized During Treatment: Gait belt Activity Tolerance: Patient tolerated treatment well Patient left: in chair;with call bell/phone within reach;with chair alarm set Nurse Communication: Mobility status PT Visit Diagnosis: Unsteadiness on feet (R26.81);Muscle weakness (generalized) (M62.81);Other abnormalities of gait and mobility (R26.89);History of falling (Z91.81)     Time: 7342-8768 PT Time Calculation (min) (ACUTE ONLY): 21 min  Charges:  $Gait Training: 8-22 mins                     Ivalee, PT Acute Rehabilitation Services Pager: [phone] Office: [place] 01/07/2019, 10:56 AM

## 2019-01-07 NOTE — Progress Notes (Signed)
Lauren Rose with attempts to get out of the bed earlier in the shift. " I am not in the [place] are keeping me as a prisoner". Pt responded when staff attempted to Reorient.  Provider was notified. One time order Haldol 2 mg given with relief . Will continue to monitor and treat pt per MD and Nursing orders.

## 2019-01-07 NOTE — Progress Notes (Signed)
PROGRESS NOTE    Lauren Rose  XIP:[id number] DOB: [birth year] DOA: 01/04/2019 PCP: Derinda Late, MD    Brief Narrative:   85 year old lady who is pretty independent from home with history of anemia, hypertension, type 2 diabetes, arthritis, hypothyroidism, depression who was admitted after she was found in the bathroom by her daughter.  Patient said that she went to bathroom and lost her balance and fell on the floor and could not get up.  Patient states that the right leg gave up. Admitted from the emergency room with rhabdomyolysis, fall and leukocytosis.  Initial skeletal survey negative.  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  Lives alone. PT recommended Home PT. Needs re-evaluation before going home.  We suggest short term SNF/ rehab then decide about long term care like ALF or NH   Subjective:  Patient in bed, appears comfortable, denies any headache, no fever, no chest pain or pressure, no shortness of breath , no abdominal pain. No focal weakness.    Assessment & Plan:    Acute multiple vascular territory stroke: Some with hemorrhagic transformation.  By stroke team, full stroke work-up done, currently on combination of aspirin and statin.  Switch to full dose aspirin on 01/08/2019.  Day event monitor ordered upon discharge.  Echo stable, A1c 6.2, LDL was 70.  CTA head and neck unremarkable.  PT, may require placement.  Will on telemetry so far.   Mild rhabdomyolysis: Treated with IV fluids.  Improved.  Renal functions normal.  Borderline elevated troponins: Insignificant.  Type II non-STEMI.  No chest pain.  Leukocytosis: With no evidence of localizing infection.  Probably due to rhabdomyolysis.  We will continue to monitor.  Hypertension: As per #1.  Home medications resumed.  We will gradually control her blood pressure.  Type 2 diabetes: On oral hypoglycemics at home.  A1c 6.2.  Keep on sliding scale insulin.  Will resume oral hypoglycemics on  discharge.  Emesis: Currently resolved.  Hypothyroidism: Clinically euthyroid.  TSH was suppressed, could be sick euthyroid, repeat TSH along with free T3-T4.    Previous MD - call placed and discussed with patient's daughter.  Patient has been living independent. With acute a stroke and debility, she will need short-term placement at skilled nursing facility or acute inpatient rehab.  Her daughter is also looking for long-term care if needed after rehab.   DVT prophylaxis: SCDs. Code Status: DNR. Family Communication: Previous MD updated patient's daughter, Juliann Pulse over the phone. Disposition Plan: Acute inpatient rehab versus SNF.   Consultants:   Neurology.  Procedures:   None.  Antimicrobials:   None.   Objective: Vitals:   01/07/19 0508 01/07/19 0514 01/07/19 0610 01/07/19 0818  BP:  (!) 176/67 (!) 161/55 (!) 159/60  Pulse:  82 80 84  Resp:  18  18  Temp:  (!) 97.5 F (36.4 C)  98.5 F (36.9 C)  TempSrc:  Oral  Oral  SpO2:  92%  (!) 89%  Weight: 65.3 kg     Height:        Intake/Output Summary (Last 24 hours) at 01/07/2019 1112 Last data filed at 01/07/2019 1026 Gross per 24 hour  Intake 440 ml  Output 200 ml  Net 240 ml   Filed Weights   01/05/19 0003 01/06/19 0607 01/07/19 0508  Weight: 63 kg 64.4 kg 65.3 kg    Examination:  Awake Alert, O x2, minimal right-sided facial droop and minimal right-sided weakness [place].AT,PERRAL Supple Neck,No JVD, No cervical lymphadenopathy appriciated.  Symmetrical Chest wall movement, Good air movement bilaterally, CTAB RRR,No Gallops, Rubs or new Murmurs, No Parasternal Heave +ve B.Sounds, Abd Soft, No tenderness, No organomegaly appriciated, No rebound - guarding or rigidity. No Cyanosis, Clubbing or edema, No new Rash or bruise    Data Reviewed: I have personally reviewed following labs and imaging studies  CBC: Recent Labs  Lab 01/04/19 1742 01/04/19 2330 01/05/19 0524  WBC 19.0* 14.6* 13.4*  NEUTROABS  17.1*  --   --   HGB 12.6 12.0 10.7*  HCT 39.8 37.4 32.3*  MCV 87.9 87.4 86.6  PLT 319 311 465   Basic Metabolic Panel: Recent Labs  Lab 01/04/19 1742 01/04/19 2330 01/05/19 0524 01/07/19 0837  NA 135  --  136  --   K 4.1  --  3.4* 3.7  CL 100  --  99  --   CO2 23  --  24  --   GLUCOSE 182*  --  113*  --   BUN 18  --  15  --   CREATININE 0.93 0.93 0.92  --   CALCIUM 9.6  --  8.9  --   MG 1.7  --   --   --    GFR: Estimated Creatinine Clearance: 32.3 mL/min (by C-G formula based on SCr of 0.92 mg/dL). Liver Function Tests: Recent Labs  Lab 01/04/19 1742  AST 42*  ALT 24  ALKPHOS 57  BILITOT 0.7  PROT 7.4  ALBUMIN 3.5   Recent Labs  Lab 01/04/19 1742  LIPASE 23   Recent Labs  Lab 01/04/19 2115  AMMONIA 12   Coagulation Profile: No results for input(s): INR, PROTIME in the last 168 hours. Cardiac Enzymes: Recent Labs  Lab 01/04/19 1742 01/04/19 2115  CKTOTAL  --  580*  TROPONINI 0.06* 0.07*   BNP (last 3 results) No results for input(s): PROBNP in the last 8760 hours. HbA1C: Recent Labs    01/04/19 2330  HGBA1C 6.2*   CBG: Recent Labs  Lab 01/06/19 0850 01/06/19 1221 01/06/19 1615 01/06/19 2117 01/07/19 0819  GLUCAP 118* 120* 127* 139* 103*   Lipid Profile: Recent Labs    01/05/19 0524 01/06/19 0321  CHOL 102 114  HDL 45 46  LDLCALC 38 48  TRIG 94 99  CHOLHDL 2.3 2.5   Thyroid Function Tests: Recent Labs    01/04/19 1749  TSH 0.160*   Anemia Panel: No results for input(s): VITAMINB12, FOLATE, FERRITIN, TIBC, IRON, RETICCTPCT in the last 72 hours. Sepsis Labs: No results for input(s): PROCALCITON, LATICACIDVEN in the last 168 hours.  Recent Results (from the past 240 hour(s))  Gram stain     Status: None   Collection Time: 01/04/19  7:38 PM  Result Value Ref Range Status   Specimen Description URINE, CLEAN CATCH  Final   Special Requests NONE  Final   Gram Stain   Final    WBC PRESENT, PREDOMINANTLY PMN GRAM NEGATIVE  RODS GRAM POSITIVE COCCI CYTOSPIN SMEAR Performed at [facility], [address], [place], [place] [address]    Report Status 01/04/2019 FINAL  Final  Urine culture     Status: None   Collection Time: 01/04/19  7:38 PM  Result Value Ref Range Status   Specimen Description URINE, CLEAN CATCH  Final   Special Requests NONE  Final   Culture   Final    NO GROWTH Performed at [facility], [place] [address], [place], [place] [address]    Report  Status 01/05/2019 FINAL  Final         Radiology Studies: Dg Swallowing Func-speech Pathology  Result Date: 01/06/2019 Objective Swallowing Evaluation: Type of Study: MBS-Modified Barium Swallow Study  Patient Details Name: ELVIE PALOMO MRN: [id number] Date of Birth: [birth year] Today's Date: 01/06/2019 Time: SLP Start Time (ACUTE ONLY): 1 -SLP Stop Time (ACUTE ONLY): 1325 SLP Time Calculation (min) (ACUTE ONLY): 25 min Past Medical History: Past Medical History: Diagnosis Date  Anemia   Arthritis   Cancer ([place])   hx of thyroid cancer  Colon polyps   Depression   Diabetes mellitus without complication ([place])   Frequency   GERD (gastroesophageal reflux disease)   History of gout   History of skin cancer   Hyperlipidemia   Hypertension   Hypothyroidism   Kidney cysts   followed by Dr. 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 Jearld Fenton every 6 months x 2 yrs  Nocturia   PONV (postoperative nausea and vomiting)   Rash   yeast infection under breasts Past Surgical History: Past Surgical History: Procedure Laterality Date  COLON SURGERY  2004  colon polyps then infection srg x3  HERNIA REPAIR  2005  THYROIDECTOMY  2006  TOTAL HIP ARTHROPLASTY Left 07/20/2013  Procedure: LEFT TOTAL HIP ARTHROPLASTY ANTERIOR APPROACH;  Surgeon: Mcarthur Rossetti, MD;  Location: [facility] ORS;  Service: Orthopedics;  Laterality: Left; HPI: Patient is a 85  y.o. female who was independent from home with history of anemia,  hypertension, type 2 diabetes, arthritis, hypothyroidism, depression who was admitted last night after she was found in the bathroom by her daughter.  Patient said that she went to bathroom and lost her balance and fell on the floor and could not get up.  Patient states that the right leg gave up. Admitted from the emergency room with rhabdomyolysis, fall and leukocytosis.  Initial skeletal survey negative.  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  Subjective: pleasant, sitting in chair in radiology suite Assessment / Plan / Recommendation CHL IP CLINICAL IMPRESSIONS 01/06/2019 Clinical Impression Patient presents with a mild oral and a mod-severe pharyngeal dysphagia with signifcantly reduced sensation leading to swallow initiation delays to level of vallecula with puree and regular solids and honey and nectar liquids, and swallow initiation delays to level of pyriform sinus with thin liquids. Patient exhibited mild oral transit delays, but was able to masticate hard solid and transit barium pill/tablet without significant difficulty. Patient aspirated before the swallow with thin liquids and during the swallow with nectar thick liquids and although she did exhibit a cough response, it was not effective to clear aspirate. Patient did not exhibit any penetration or aspiration of honey thick liquids(on MBS imaging slides, at the end of study, SLP mislabeled a nectar liquid trial with 'H' for honey thick liquids). Patient did exhibit vallecular and pyriform sinus residuals with all tested consistencies (minimal for thin liquids, mild-moderate for honey thick, puree, regular solids) but did not exhibit any aspiration after the swallow with these residuals, though cannot r/o risk from this type of event occuring. Chin tuck was not effective to prevent penetration or aspiration with thin liquids and inconsistently effective with nectar thick liquids.  SLP Visit Diagnosis Dysphagia, oropharyngeal phase  (R13.12) Attention and concentration deficit following -- Frontal lobe and executive function deficit following -- Impact on safety and function Severe aspiration risk   CHL IP TREATMENT RECOMMENDATION 01/06/2019 Treatment Recommendations Therapy as outlined in treatment plan below  Prognosis 01/06/2019 Prognosis for Safe Diet Advancement Fair Barriers to Reach Goals Severity of deficits Barriers/Prognosis Comment -- CHL IP DIET RECOMMENDATION 01/06/2019 SLP Diet Recommendations Regular solids;Honey thick liquids Liquid Administration via Cup Medication Administration Whole meds with puree Compensations Slow rate;Small sips/bites Postural Changes Seated upright at 90 degrees   CHL IP OTHER RECOMMENDATIONS 01/06/2019 Recommended Consults -- Oral Care Recommendations Oral care BID Other Recommendations Prohibited food (jello, ice cream, thin soups);Order thickener from pharmacy;Clarify dietary restrictions;Remove water pitcher   CHL IP FOLLOW UP RECOMMENDATIONS 01/06/2019 Follow up Recommendations Home health SLP   CHL IP FREQUENCY AND DURATION 01/06/2019 Speech Therapy Frequency (ACUTE ONLY) min 2x/week Treatment Duration 2 weeks      CHL IP ORAL PHASE 01/06/2019 Oral Phase Impaired Oral - Pudding Teaspoon -- Oral - Pudding Cup -- Oral - Honey Teaspoon -- Oral - Honey Cup -- Oral - Nectar Teaspoon -- Oral - Nectar Cup -- Oral - Nectar Straw -- Oral - Thin Teaspoon Premature spillage Oral - Thin Cup Premature spillage Oral - Thin Straw -- Oral - Puree Weak lingual manipulation;Delayed oral transit Oral - Mech Soft -- Oral - Regular Delayed oral transit;Weak lingual manipulation Oral - Multi-Consistency -- Oral - Pill Delayed oral transit Oral Phase - Comment --  CHL IP PHARYNGEAL PHASE 01/06/2019 Pharyngeal Phase Impaired Pharyngeal- Pudding Teaspoon -- Pharyngeal -- Pharyngeal- Pudding Cup -- Pharyngeal -- Pharyngeal- Honey Teaspoon -- Pharyngeal -- Pharyngeal- Honey Cup -- Pharyngeal -- Pharyngeal- Nectar Teaspoon --  Pharyngeal -- Pharyngeal- Nectar Cup Delayed swallow initiation-vallecula;Delayed swallow initiation-pyriform sinuses;Reduced airway/laryngeal closure;Penetration/Aspiration during swallow;Trace aspiration;Pharyngeal residue - valleculae;Pharyngeal residue - pyriform Pharyngeal Material enters airway, passes BELOW cords and not ejected out despite cough attempt by patient Pharyngeal- Nectar Straw -- Pharyngeal -- Pharyngeal- Thin Teaspoon Delayed swallow initiation-pyriform sinuses;Reduced airway/laryngeal closure;Penetration/Aspiration before swallow;Penetration/Aspiration during swallow;Pharyngeal residue - valleculae;Pharyngeal residue - pyriform;Trace aspiration Pharyngeal Material enters airway, passes BELOW cords and not ejected out despite cough attempt by patient Pharyngeal- Thin Cup Delayed swallow initiation-pyriform sinuses;Moderate aspiration;Pharyngeal residue - pyriform;Penetration/Aspiration before swallow Pharyngeal Material enters airway, passes BELOW cords and not ejected out despite cough attempt by patient Pharyngeal- Thin Straw -- Pharyngeal -- Pharyngeal- Puree Delayed swallow initiation-vallecula;Pharyngeal residue - valleculae;Pharyngeal residue - pyriform Pharyngeal -- Pharyngeal- Mechanical Soft -- Pharyngeal -- Pharyngeal- Regular Delayed swallow initiation-vallecula;Pharyngeal residue - valleculae;Pharyngeal residue - pyriform Pharyngeal -- Pharyngeal- Multi-consistency -- Pharyngeal -- Pharyngeal- Pill Delayed swallow initiation-vallecula Pharyngeal -- Pharyngeal Comment --  CHL IP CERVICAL ESOPHAGEAL PHASE 01/06/2019 Cervical Esophageal Phase WFL Pudding Teaspoon -- Pudding Cup -- Honey Teaspoon -- Honey Cup -- Nectar Teaspoon -- Nectar Cup -- Nectar Straw -- Thin Teaspoon -- Thin Cup -- Thin Straw -- Puree -- Mechanical Soft -- Regular -- Multi-consistency -- Pill -- Cervical Esophageal Comment -- Sonia Baller, MA, CCC-SLP Speech Therapy [facility] Acute Rehab              Vas [place] Lower  Extremity Venous (dvt)  Result Date: 01/07/2019  Lower Venous Study Indications: Embolic stroke.  Performing Technologist: Antonieta Pert RDMS, RVT  Examination Guidelines: A complete evaluation includes B-mode imaging, spectral Doppler, color Doppler, and power Doppler as needed of all accessible portions of each vessel. Bilateral testing is considered an integral part of a complete examination. Limited examinations for reoccurring indications may be performed as noted.  Right Venous Findings: +---------+---------------+---------+-----------+----------+-------+            Compressibility Phasicity Spontaneity Properties Summary  +---------+---------------+---------+-----------+----------+-------+  CFV       Full  Yes       Yes                             +---------+---------------+---------+-----------+----------+-------+  SFJ       Full                                                      +---------+---------------+---------+-----------+----------+-------+  FV Prox   Full                                                      +---------+---------------+---------+-----------+----------+-------+  FV Mid    Full                                                      +---------+---------------+---------+-----------+----------+-------+  FV Distal Full                                                      +---------+---------------+---------+-----------+----------+-------+  PFV       Full                                                      +---------+---------------+---------+-----------+----------+-------+  POP       Full            Yes       Yes                             +---------+---------------+---------+-----------+----------+-------+  PTV       Full                                                      +---------+---------------+---------+-----------+----------+-------+  PERO      Full                                                       +---------+---------------+---------+-----------+----------+-------+  GSV       Full                                                      +---------+---------------+---------+-----------+----------+-------+  Left Venous Findings: +---------+---------------+---------+-----------+----------+-------+  Compressibility Phasicity Spontaneity Properties Summary  +---------+---------------+---------+-----------+----------+-------+  CFV       Full            Yes       Yes                             +---------+---------------+---------+-----------+----------+-------+  SFJ       Full                                                      +---------+---------------+---------+-----------+----------+-------+  FV Prox   Full                                                      +---------+---------------+---------+-----------+----------+-------+  FV Mid    Full                                                      +---------+---------------+---------+-----------+----------+-------+  FV Distal Full                                                      +---------+---------------+---------+-----------+----------+-------+  PFV       Full                                                      +---------+---------------+---------+-----------+----------+-------+  POP       Full            Yes       Yes                             +---------+---------------+---------+-----------+----------+-------+  PTV       Full                                                      +---------+---------------+---------+-----------+----------+-------+  PERO      Full                                                      +---------+---------------+---------+-----------+----------+-------+  GSV       Full                                                      +---------+---------------+---------+-----------+----------+-------+  Summary: Right: There is no evidence of deep vein thrombosis in the lower extremity. No cystic structure found in the popliteal  fossa. Atherosclerotic changes seen throughout extremity. Left: There is no evidence of deep vein thrombosis in the lower extremity. No cystic structure found in the popliteal fossa. Atherosclerotic changes seen throughout extremity.  *See table(s) above for measurements and observations. Electronically signed by Deitra Mayo MD on 01/07/2019 at 6:44:58 AM.    Final     Scheduled Meds:  amLODipine  10 mg Oral q morning - 10a   aspirin EC  81 mg Oral Daily   insulin aspart  0-9 Units Subcutaneous TID WC   levothyroxine  125 mcg Oral QHS   linagliptin  5 mg Oral Daily   pantoprazole  40 mg Oral BID   PARoxetine  10 mg Oral q morning - 10a   simvastatin  10 mg Oral QPM   Continuous Infusions:    LOS: 3 days    Time spent: 25 minutes.   Signature  Lala Lund M.D on 01/07/2019 at 11:12 AM   -  To page go to [email or web address]

## 2019-01-07 NOTE — Progress Notes (Signed)
Occupational Therapy Treatment Patient Details Name: Lauren Rose MRN: [id number] DOB: 1922/04/15 Today's Date: 01/07/2019    History of present illness 85 year old lady who is independent from home with history of anemia, hypertension, type 2 diabetes, arthritis, hypothyroidism, depression who was admitted last night after she was found in the bathroom by her daughter. Admitted from the emergency room with rhabdomyolysis, fall and leukocytosis.  Initial skeletal survey negative.  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   OT comments  Pt performing sit to stand with minguardA; transfers and ADL functional mobility with minguardA. Pt stood at sink x2 mins for light grooming. Pt incontinent and requiring modA for toilet hygiene. Pt would greatly benefit from continued OT skilled services as RLE continues to hurt. OT to continue to follow acutely.   Follow Up Recommendations  Home health OT;Supervision - Intermittent    Equipment Recommendations  3 in 1 bedside commode    Recommendations for Other Services      Precautions / Restrictions Precautions Precautions: Fall Precaution Comments: incontinent       Mobility Bed Mobility Overal bed mobility: Needs Assistance             General bed mobility comments: OOB upon arrival  Transfers Overall transfer level: Needs assistance Equipment used: Rolling walker (2 wheeled) Transfers: Sit to/from [facility] Sit to Stand: Min guard Stand pivot transfers: Min guard       General transfer comment: cues for hand placement    Balance Overall balance assessment: Needs assistance Sitting-balance support: Feet supported;No upper extremity supported Sitting balance-Leahy Scale: Fair     Standing balance support: Bilateral upper extremity supported;During functional activity Standing balance-Leahy Scale: Poor                 High Level Balance Comments: minguardA           ADL either  performed or assessed with clinical judgement   ADL Overall ADL's : Needs assistance/impaired     Grooming: Set up;Sitting                               Functional mobility during ADLs: Min guard;Rolling walker General ADL Comments: Pt minguardA for ADL mobility and set-upA at sink for light grooming; pt required assist for toilet hygiene as pt incontinent.     Vision       Perception     Praxis      Cognition Arousal/Alertness: Awake/alert Behavior During Therapy: WFL for tasks assessed/performed Overall Cognitive Status: Impaired/Different from baseline Area of Impairment: Orientation;Attention;Memory;Problem solving;Safety/judgement                             Problem Solving: Slow processing General Comments: Pt confused of current situation.        Exercises     Shoulder Instructions       General Comments Pt requires constant cueing to attend to tasks.    Pertinent Vitals/ Pain       Pain Assessment: No/denies pain  Home Living                                          Prior Functioning/Environment              Frequency  Min 3X/week  Progress Toward Goals  OT Goals(current goals can now be found in the care plan section)  Progress towards OT goals: Progressing toward goals  Acute Rehab OT Goals Patient Stated Goal: not stated. OT Goal Formulation: With patient Potential to Achieve Goals: Good ADL Goals Pt Will Perform Grooming: with modified independence;standing Pt Will Perform Lower Body Bathing: with modified independence;sit to/from stand Pt Will Perform Lower Body Dressing: with modified independence;sit to/from stand Pt Will Transfer to Toilet: with modified independence;ambulating Pt Will Perform Toileting - Clothing Manipulation and hygiene: with modified independence;sit to/from stand Additional ADL Goal #1: Pt will independently verbalize 1 energy conservation strategy to  incorporate into ADLs.  Plan Discharge plan remains appropriate    Co-evaluation                 AM-PAC OT "6 Clicks" Daily Activity     Outcome Measure   Help from another person eating meals?: None Help from another person taking care of personal grooming?: None Help from another person toileting, which includes using toliet, bedpan, or urinal?: A Little Help from another person bathing (including washing, rinsing, drying)?: A Little Help from another person to put on and taking off regular upper body clothing?: None Help from another person to put on and taking off regular lower body clothing?: None 6 Click Score: 22    End of Session Equipment Utilized During Treatment: Gait belt;Rolling walker  OT Visit Diagnosis: Unsteadiness on feet (R26.81);History of falling (Z91.81)   Activity Tolerance Patient tolerated treatment well   Patient Left in chair;with call bell/phone within reach;with chair alarm set   Nurse Communication Mobility status        Time: 9450-3888 OT Time Calculation (min): 29 min  Charges: OT General Charges $OT Visit: 1 Visit OT Treatments $Self Care/Home Management : 23-37 mins  Ebony Hail Harold Hedge) Marsa Aris OTR/L Acute Rehabilitation Services Pager: [phone] Office: [phone]    Fredda Hammed 01/07/2019, 3:47 PM

## 2019-01-08 ENCOUNTER — Other Ambulatory Visit: Payer: Self-pay | Admitting: Physician Assistant

## 2019-01-08 DIAGNOSIS — I639 Cerebral infarction, unspecified: Secondary | ICD-10-CM

## 2019-01-08 LAB — T3: T3, Total: 61 ng/dL — ABNORMAL LOW (ref 71–180)

## 2019-01-08 LAB — GLUCOSE, CAPILLARY
Glucose-Capillary: 103 mg/dL — ABNORMAL HIGH (ref 70–99)
Glucose-Capillary: 149 mg/dL — ABNORMAL HIGH (ref 70–99)

## 2019-01-08 MED ORDER — ALPRAZOLAM 0.25 MG PO TABS: 0.1250 mg | ORAL_TABLET | Freq: Every day | ORAL | 0 refills | Status: DC | PRN

## 2019-01-08 MED ORDER — ASPIRIN 325 MG PO TBEC: 325.0000 mg | DELAYED_RELEASE_TABLET | Freq: Every day | ORAL | 0 refills | Status: DC

## 2019-01-08 MED ORDER — LOSARTAN POTASSIUM 100 MG PO TABS: 100.0000 mg | ORAL_TABLET | Freq: Every morning | ORAL | Status: DC

## 2019-01-08 MED ORDER — RESOURCE THICKENUP CLEAR PO POWD: ORAL | 0 refills | Status: DC

## 2019-01-08 NOTE — Plan of Care (Signed)
  Problem: Education: Goal: Knowledge of secondary prevention will improve Outcome: Progressing

## 2019-01-08 NOTE — TOC Progression Note (Signed)
Transition of Care [facility]) - Progression Note    Patient Details  Name: CELISSE CIULLA MRN: [id number] Date of Birth: 17-May-1922  Transition of Care [facility]) CM/SW [place], LCSW Phone Number: 01/08/2019, 12:00 PM  Clinical Narrative:    Patient's daughter has requested [facility]). They are able to accept patient today.    Expected Discharge Plan: [place] Barriers to Discharge: No Barriers Identified  Expected Discharge Plan and Services Expected Discharge Plan: [place] In-house Referral: Clinical Social Work Discharge Planning Services: NA Post Acute Care Choice: [place] Living arrangements for the past 2 months: Single Family Home Expected Discharge Date: 01/08/19               DME Arranged: N/A DME Agency: NA HH Arranged: NA HH Agency: NA   Social Determinants of Health (SDOH) Interventions    Readmission Risk Interventions Readmission Risk Prevention Plan 01/08/2019  Post Dischage Appt Complete  Medication Screening Complete  Transportation Screening Complete  Some recent data might be hidden

## 2019-01-08 NOTE — TOC Initial Note (Signed)
Transition of Care [facility]) - Initial/Assessment Note    Patient Details  Name: Lauren Rose MRN: [id number] Date of Birth: 1922-08-27  Transition of [facility] CM/SW Contact:    Benard Halsted, LCSW Phone Number: 01/08/2019, 10:24 AM  Clinical Narrative:                 CSW received consult for possible SNF placement at time of discharge. CSW spoke with patient and her daughter regarding PT recommendation of SNF placement at time of discharge. Patient reported that patient's daughter is currently unable to care for patient at her home given patient's current physical needs and fall risk. Patient expressed understanding of PT recommendation and is agreeable to SNF placement at time of discharge. Patient's daughter reports preference for somewhere near [facility]. CSW discussed insurance authorization process and provided Medicare SNF ratings list. Patient expressed being hopeful for rehab and to feel better soon. No further questions reported at this time. CSW to continue to follow and assist with discharge planning needs.   Expected Discharge Plan: Skilled Nursing Facility Barriers to Discharge: No Barriers Identified   Patient Goals and CMS Choice Patient states their goals for this hospitalization and ongoing recovery are:: Rehab CMS Medicare.gov Compare Post Acute Care list provided to:: Other (Comment Required)(Daughter) Choice offered to / list presented to : Adult Children  Expected Discharge Plan and Services Expected Discharge Plan: [place] In-house Referral: Clinical Social Work Discharge Planning Services: NA Post Acute Care Choice: [place] Living arrangements for the past 2 months: [place] Expected Discharge Date: 01/08/19               DME Arranged: N/A DME Agency: NA HH Arranged: NA [place] Agency: NA  Prior Living Arrangements/Services Living arrangements for the past 2 months: [place] with:: Self Patient  language and need for interpreter reviewed:: Yes Do you feel safe going back to the place where you live?: No   alone  Need for Family Participation in Patient Care: Yes (Comment) Care giver support system in place?: Yes (comment) Current home services: DME Criminal Activity/Legal Involvement Pertinent to Current Situation/Hospitalization: No - Comment as needed  Activities of Daily Living Home Assistive Devices/Equipment: Environmental consultant (specify type), Wheelchair, Radio producer (specify quad or straight) ADL Screening (condition at time of admission) Patient's cognitive ability adequate to safely complete daily activities?: No Is the patient deaf or have difficulty hearing?: No Does the patient have difficulty seeing, even when wearing glasses/contacts?: Yes Does the patient have difficulty concentrating, remembering, or making decisions?: Yes Patient able to express need for assistance with ADLs?: Yes Does the patient have difficulty dressing or bathing?: Yes Independently performs ADLs?: Yes (appropriate for developmental age) Does the patient have difficulty walking or climbing stairs?: Yes Weakness of Legs: Both Weakness of Arms/Hands: None  Permission Sought/Granted Permission sought to share information with : Facility Sport and exercise psychologist, Family Supports Permission granted to share information with : Yes, Verbal Permission Granted  Share Information with NAME: Barnetta Chapel  Permission granted to share info w AGENCY: SNFs  Permission granted to share info w Relationship: Daughter  Permission granted to share info w Contact Information: [phone]  Emotional Assessment Appearance:: Appears stated age Attitude/Demeanor/Rapport: Gracious Affect (typically observed): Accepting, Appropriate Orientation: : Oriented to Self, Oriented to Place, Oriented to  Time, Oriented to Situation Alcohol / Substance Use: Not Applicable Psych Involvement: No (comment)  Admission diagnosis:  Dysarthria  [R47.1] Troponin level elevated [R79.89] Traumatic rhabdomyolysis, initial encounter (  [place]) [T79.6XXA] Patient Active Problem List   Diagnosis Date Noted  . Acute ischemic stroke ([place]) 01/05/2019  . Rhabdomyolysis 01/04/2019  . Essential hypertension 01/04/2019  . Diabetes ([place]) 01/04/2019  . Hypothyroidism 01/04/2019  . Emesis 01/04/2019  . Unwitnessed fall 01/04/2019  . Degenerative arthritis of hip, left 07/20/2013   PCP:  Derinda Late, MD Pharmacy:   [id number] [place], [place]. AT [address] [place] [address] Phone: [phone] Fax: [phone]     Social Determinants of Health (SDOH) Interventions    Readmission Risk Interventions Readmission Risk Prevention Plan 01/08/2019  Post Dischage Appt Complete  Medication Screening Complete  Transportation Screening Complete  Some recent data might be hidden

## 2019-01-08 NOTE — TOC Transition Note (Signed)
Transition of Care [facility]) - CM/SW Discharge Note   Patient Details  Name: Lauren Rose MRN: [id number] Date of Birth: 04/21/1922  [facility]) CM/SW Contact:  Benard Halsted, LCSW Phone Number: 01/08/2019, 12:33 PM   Clinical Narrative:    Patient will DC to: [facility] Anticipated DC date: 01/08/19 Family notified: Tye [place], daughter Transport by: Glenna Fellows   Per MD patient ready for DC to [facility]. RN, patient, patient's family, and facility notified of DC. Discharge Summary and FL2 sent to facility. RN to call report prior to discharge [phone]). DC packet on chart. Ambulance transport requested for patient.   CSW will sign off for now as social work intervention is no longer needed. Please consult [place] again if new needs arise.  Cedric Fishman, LCSW Clinical Social Worker [phone]    Final next level of care: Skilled Nursing Facility Barriers to Discharge: No Barriers Identified   Patient Goals and CMS Choice Patient states their goals for this hospitalization and ongoing recovery are:: Rehab CMS Medicare.gov Compare Post Acute Care list provided to:: Other (Comment Required)(Daughter) Choice offered to / list presented to : Adult Children  Discharge Placement PASRR number recieved: 01/08/19            Patient chooses bed at: [facility] Patient to be transferred to facility by: [place] Name of family member notified: Daughter, Tye [place] Patient and family notified of of transfer: 01/08/19  Discharge Plan and Services In-house Referral: Clinical Social Work Discharge Planning Services: NA Post Acute Care Choice: [place]          DME Arranged: N/A DME Agency: NA HH Arranged: NA HH Agency: NA   Social Determinants of Health (SDOH) Interventions     Readmission Risk Interventions Readmission Risk Prevention Plan 01/08/2019  Post Dischage Appt Complete  Medication Screening Complete  Transportation  Screening Complete  Some recent data might be hidden

## 2019-01-08 NOTE — Progress Notes (Signed)
Physical Therapy Treatment Patient Details Name: Lauren Rose MRN: [id number] DOB: 1922-04-07 Today's Date: 01/08/2019    History of Present Illness 85 year old lady who is independent from home with history of anemia, hypertension, type 2 diabetes, arthritis, hypothyroidism, depression who was admitted last night after she was found in the bathroom by her daughter. Admitted from the emergency room with rhabdomyolysis, fall and leukocytosis.  Initial skeletal survey negative.  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    PT Comments    Continuing work on functional mobility and activity tolerance;  Session today focused on functional transfers (in the setting of toileting); Fatigued today, but participating well; Showing insight into her condition, and aware that she needs rehab to maximize independence and safety with mobility; Agree with dc to SNF.   Follow Up Recommendations  SNF;Supervision/Assistance - 24 hour     Equipment Recommendations  Rolling walker with 5" wheels    Recommendations for Other Services       Precautions / Restrictions Precautions Precautions: Fall Precaution Comments: incontinent    Mobility  Bed Mobility Overal bed mobility: Needs Assistance Bed Mobility: Supine to Sit     Supine to sit: Mod assist     General bed mobility comments: Good initiation; mod assist and use of bed pad to square off hips at EOB  Transfers Overall transfer level: Needs assistance Equipment used: Rolling walker (2 wheeled) Transfers: Sit to/from Stand Sit to Stand: Mod assist         General transfer comment: cues for hand placement; Light mod assist to powerup; incontinent of urine upon standing  Ambulation/Gait Ambulation/Gait assistance: Min guard Gait Distance (Feet): (pivotal steps bed to [facility] and BSC to recliner) Assistive device: Rolling walker (2 wheeled) Gait Pattern/deviations: Step-through pattern;Trunk flexed;Decreased stride length      General Gait Details: slow slightly unsteady gait with reliance on RW and guarding for safety and direction   Stairs             Wheelchair Mobility    Modified Rankin (Stroke Patients Only) Modified Rankin (Stroke Patients Only) Pre-Morbid Rankin Score: No symptoms Modified Rankin: Moderately severe disability     Balance Overall balance assessment: Needs assistance Sitting-balance support: Feet supported;No upper extremity supported Sitting balance-Leahy Scale: Fair       Standing balance-Leahy Scale: Poor                              Cognition Arousal/Alertness: Awake/alert Behavior During Therapy: WFL for tasks assessed/performed                                 Problem Solving: Slow processing        Exercises      General Comments General comments (skin integrity, edema, etc.): Pt had a successful BM while on the [facility] in session      Pertinent Vitals/Pain Pain Assessment: No/denies pain    Home Living                      Prior Function            PT Goals (current goals can now be found in the care plan section) Acute Rehab PT Goals PT Goal Formulation: With patient Time For Goal Achievement: 01/19/19 Potential to Achieve Goals: Good Progress towards PT goals: (Limited by fatigue today)    Frequency  Min 3X/week      PT Plan Current plan remains appropriate    Co-evaluation              AM-PAC PT "6 Clicks" Mobility   Outcome Measure  Help needed turning from your back to your side while in a flat bed without using bedrails?: A Little Help needed moving from lying on your back to sitting on the side of a flat bed without using bedrails?: A Little Help needed moving to and from a bed to a chair (including a wheelchair)?: A Lot Help needed standing up from a chair using your arms (e.g., wheelchair or bedside chair)?: A Lot Help needed to walk in hospital room?: A Little Help needed  climbing 3-5 steps with a railing? : A Lot 6 Click Score: 15    End of Session Equipment Utilized During Treatment: Gait belt Activity Tolerance: Patient tolerated treatment well Patient left: in chair;with call bell/phone within reach;with chair alarm set Nurse Communication: Mobility status PT Visit Diagnosis: Unsteadiness on feet (R26.81);Muscle weakness (generalized) (M62.81);Other abnormalities of gait and mobility (R26.89);History of falling (Z91.81)     Time: 4034-7425 PT Time Calculation (min) (ACUTE ONLY): 26 min  Charges:  $Therapeutic Activity: 23-37 mins                     Roney Marion, PT  Acute Rehabilitation Services Pager [phone] Office [place] 01/08/2019, 2:17 PM

## 2019-01-08 NOTE — NC FL2 (Signed)
[place] LEVEL OF CARE SCREENING TOOL     IDENTIFICATION  Patient Name: Lauren Rose Birthdate: Feb 20, 1922 Sex: female Admission Date (Current Location): 01/04/2019  [facility] and [place] Number:  Herbalist and Address:  [facility], [place] [address], [place], [place] [address]      Provider Number: [id number]  Attending Physician Name and Address:  Thurnell Lose, MD  Relative Name and Phone Number:  Barnetta Chapel, daughter, [phone]     Current Level of Care: Hospital Recommended Level of Care: [place] Prior Approval Number:    Date Approved/Denied:   PASRR Number: [id number]  Discharge Plan: SNF    Current Diagnoses: Patient Active Problem List   Diagnosis Date Noted  . Acute ischemic stroke ([place]) 01/05/2019  . Rhabdomyolysis 01/04/2019  . Essential hypertension 01/04/2019  . Diabetes ([place]) 01/04/2019  . Hypothyroidism 01/04/2019  . Emesis 01/04/2019  . Unwitnessed fall 01/04/2019  . Degenerative arthritis of hip, left 07/20/2013    Orientation RESPIRATION BLADDER Height & Weight     Self, Time, Situation, Place  Normal Continent Weight: 130 lb (59 kg) Height:  5\' 4"  (162.6 cm)  BEHAVIORAL SYMPTOMS/MOOD NEUROLOGICAL BOWEL NUTRITION STATUS      Continent Diet(Please see DC Summary )  AMBULATORY STATUS COMMUNICATION OF NEEDS Skin   Extensive Assist Verbally Normal                       Personal Care Assistance Level of Assistance  Bathing, Feeding, Dressing Bathing Assistance: Maximum assistance Feeding assistance: Limited assistance Dressing Assistance: Limited assistance     Functional Limitations Info  Sight, Hearing, Speech Sight Info: Impaired Hearing Info: Adequate Speech Info: Adequate    SPECIAL CARE FACTORS FREQUENCY  PT (By licensed PT), OT (By licensed OT)     PT Frequency: 5x/week OT Frequency: 3x/week            Contractures Contractures Info: Not present     Additional Factors Info  Code Status, Allergies, Psychotropic, Insulin Sliding Scale Code Status Info: DNR Allergies Info: NKA Psychotropic Info: P[place]il Insulin Sliding Scale Info: 3x daily with meals       Current Medications (01/08/2019):  This is the current hospital active medication list Current Facility-Administered Medications  Medication Dose Route Frequency Provider Last Rate Last Dose  . acetaminophen (TYLENOL) tablet 650 mg  650 mg Oral Q6H PRN Mercy Riding, MD       Or  . acetaminophen (TYLENOL) suppository 650 mg  650 mg Rectal Q6H PRN Gonfa, Taye T, MD      . amLODipine (NORVASC) tablet 10 mg  10 mg Oral q morning - 10a Wendee Beavers T, MD   10 mg at 01/07/19 0957  . aspirin EC tablet 325 mg  325 mg Oral Daily Lala Lund K, MD      . insulin aspart (novoLOG) injection 0-9 Units  0-9 Units Subcutaneous TID WC Mercy Riding, MD   2 Units at 01/07/19 1218  . levothyroxine (SYNTHROID, LEVOTHROID) tablet 125 mcg  125 mcg Oral QHS Mercy Riding, MD   125 mcg at 01/07/19 2216  . linagliptin (TRADJENTA) tablet 5 mg  5 mg Oral Daily Wendee Beavers T, MD   5 mg at 01/07/19 0957  . ondansetron (ZOFRAN) tablet 4 mg  4 mg Oral Q6H PRN Wendee Beavers T, MD       Or  . ondansetron (ZOFRAN) injection 4 mg  4 mg Intravenous Q6H PRN Gonfa, Taye T, MD      . oxyCODONE (Oxy IR/ROXICODONE) immediate release tablet 5 mg  5 mg Oral Q4H PRN Gonfa, Taye T, MD      . pantoprazole (PROTONIX) EC tablet 40 mg  40 mg Oral BID Barb Merino, MD   40 mg at 01/07/19 2216  . PARoxetine (PAXIL) tablet 10 mg  10 mg Oral q morning - 10a Wendee Beavers T, MD   10 mg at 01/07/19 0957  . polyethylene glycol (MIRALAX / GLYCOLAX) packet 17 g  17 g Oral Daily PRN Mercy Riding, MD      . Resource ThickenUp Clear   Oral PRN Barb Merino, MD      . simvastatin (ZOCOR) tablet 10 mg  10 mg Oral QPM Rosalin Hawking, MD   10 mg at 01/07/19 1836  . traZODone (DESYREL) tablet 25 mg  25 mg Oral QHS PRN Mercy Riding, MD   25 mg  at 01/07/19 2216     Discharge Medications: Please see discharge summary for a list of discharge medications.  Relevant Imaging Results:  Relevant Lab Results:   Additional Information SSN: [employer], [place]

## 2019-01-08 NOTE — Discharge Summary (Signed)
Lauren Rose TMA:[id number] DOB: [birth year] DOA: 01/04/2019  PCP: Derinda Late, MD  Admit date: 01/04/2019  Discharge date: 01/08/2019  Admitted From: Home   Disposition:  SNF if patient agrees or else HHPT   Recommendations for Outpatient Follow-up:   Follow up with PCP in 1-2 weeks  PCP Please obtain BMP/CBC, 2 view CXR in 1week,  (see Discharge instructions)   PCP Please follow up on the following pending results:     Home Health: PT,RN,SLP, S work, Aide  Equipment/Devices: Conservation officer, nature  Consultations: Neuro Discharge Condition: Stable   CODE STATUS: DNR   Diet Recommendation: Heart Healthy Low Carb, honey thick liquids    CC - Weakness   Brief history of present illness from the day of admission and additional interim summary     85 year old lady who is pretty independent from home with history of anemia, hypertension, type 2 diabetes, arthritis, hypothyroidism, depression who was admitted after she was found in the bathroom by her daughter.  Patient said that she went to bathroom and lost her balance and fell on the floor and could not get up.  Patient states that the right leg gave up. Admitted from the emergency room with rhabdomyolysis, fall and leukocytosis.  Initial skeletal survey negative.  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  Lives alone. PT recommended Home PT. Needs re-evaluation before going home.  We suggest short term SNF/ rehab then decide about long term care like ALF or NH                                                                 Hospital Course    Acute multiple vascular territory stroke: Some with hemorrhagic transformation.   Symptoms have largely resolved with minimal right-sided facial droop remaining, she was seen by the stroke team, full stroke work-up  done, currently on combination of aspirin and statin.  Switched to full dose aspirin on 01/08/2019 as recommended by neurology.   Home dose statin continued, 30 Day event monitor ordered upon discharge.  Echo stable, A1c 6.2, LDL was 70.  CTA head and neck unremarkable.  Seen by PT OT speech, currently on honey thick liquids which will be continued.  In my opinion patient will be safest to go to a nursing home however she is of sound mind and is refusing it, I have discussed the situation with patient's daughter if she is able to make patient agreed to going to a nursing home she will be discharged to nursing home today if not then she will be discharged home with PT, RN, aide, speech, Education officer, museum.  Daughter will provide supervision.  Patient is of sound mind and repeatedly refusing nursing home saying that her daughter can move in with her and provide her care.   Mild rhabdomyolysis: Treated  with IV fluids.  Improved.  Renal functions normal.  Borderline elevated troponins: Insignificant.  Type II non-STEMI.  No chest pain.  Stable echocardiogram.  Leukocytosis: With no evidence of localizing infection.  Probably due to rhabdomyolysis.  We will continue to monitor.  Hypertension: As per #1.    Continue home medications as adjusted below she was both on ACE and ARB ARB has been discontinued is to be resumed in 2 days allowing for recent CVA.  Type 2 diabetes: On oral hypoglycemics at home.  A1c 6.2.    Continue home regimen upon discharge.  Emesis: Clinically resolved likely due to constipation  Hypothyroidism: Clinically euthyroid.  TSH was suppressed, initially due to sick euthyroid, repeat TSH and free T4 were stable   Discharge diagnosis     Principal Problem:   Rhabdomyolysis Active Problems:   Essential hypertension   Diabetes ([place])   Hypothyroidism   Emesis   Unwitnessed fall   Acute ischemic stroke [facility])    Discharge instructions    Discharge Instructions     Ambulatory referral to Neurology   Complete by:  As directed    Follow up with stroke clinic NP (Jessica Vanschaick or Cecille Rubin, if both not available, consider Zachery Dauer, or [place]) at [facility] in about 4 weeks. Thanks.   Diet - low sodium heart healthy   Complete by:  As directed    Discharge instructions   Complete by:  As directed    Follow with Primary MD Derinda Late, MD in 7 days   Get CBC, CMP, 2 view Chest X ray -  checked  by Primary MD or SNF MD in 5-7 days   Activity: As tolerated with Full fall precautions use walker/cane & assistance as needed  Disposition Home or SNF if patient agrees  Diet: Heart Healthy Low Carb with honey thick liquids, with feeding assistance and aspiration precautions.    Special Instructions: If you have smoked or chewed Tobacco  in the last 2 yrs please stop smoking, stop any regular Alcohol  and or any Recreational drug use.  On your next visit with your primary care physician please Get Medicines reviewed and adjusted.  Please request your Prim.MD to go over all Hospital Tests and Procedure/Radiological results at the follow up, please get all Hospital records sent to your Prim MD by signing hospital release before you go home.  If you experience worsening of your admission symptoms, develop shortness of breath, life threatening emergency, suicidal or homicidal thoughts you must seek medical attention immediately by calling 911 or calling your MD immediately  if symptoms less severe.  You Must read complete instructions/literature along with all the possible adverse reactions/side effects for all the Medicines you take and that have been prescribed to you. Take any new Medicines after you have completely understood and accpet all the possible adverse reactions/side effects.   Increase activity slowly   Complete by:  As directed       Discharge Medications   Allergies as of 01/08/2019   No Known Allergies     Medication List      STOP taking these medications   valsartan 320 MG tablet Commonly known as:  DIOVAN     TAKE these medications   acetaminophen 650 MG CR tablet Commonly known as:  TYLENOL Take 1,300 mg by mouth every 8 (eight) hours as needed for pain.   ALPRAZolam 0.25 MG tablet Commonly known as:  XANAX Take 0.5-1 tablets (0.125-0.25 mg total) by mouth daily  as needed for anxiety or sleep.   amLODipine 10 MG tablet Commonly known as:  NORVASC Take 10 mg by mouth every morning.   aspirin 325 MG EC tablet Take 1 tablet (325 mg total) by mouth daily with breakfast.   atenolol 25 MG tablet Commonly known as:  TENORMIN Take 12.5 mg by mouth 2 (two) times daily.   calcium-vitamin D 500-200 MG-UNIT tablet Commonly known as:  OSCAL WITH D Take 1 tablet by mouth daily with breakfast.   diclofenac sodium 1 % Gel Commonly known as:  VOLTAREN Apply 2 g topically 4 (four) times daily as needed (joint pain).   estradiol 0.1 MG/GM vaginal cream Commonly known as:  ESTRACE Place 2 g vaginally 2 (two) times a week.   etodolac 200 MG capsule Commonly known as:  LODINE Take 200 mg by mouth every morning.   ferrous sulfate 325 (65 FE) MG tablet Take 325 mg by mouth daily with breakfast.   hydrochlorothiazide 25 MG tablet Commonly known as:  HYDRODIURIL Take 25 mg by mouth every morning.   levothyroxine 125 MCG tablet Commonly known as:  SYNTHROID, LEVOTHROID Take 125 mcg by mouth daily before breakfast.   losartan 100 MG tablet Commonly known as:  COZAAR Take 1 tablet (100 mg total) by mouth every morning. Start taking on:  January 10, 2019 What changed:  These instructions start on January 10, 2019. If you are unsure what to do until then, ask your doctor or other care provider.   multivitamin with minerals Tabs tablet Take 1 tablet by mouth daily.   omeprazole 20 MG capsule Commonly known as:  PRILOSEC Take 20 mg by mouth daily.   PARoxetine 10 MG tablet Commonly known as:  PAXIL Take  10 mg by mouth every morning.   Resource ThickenUp Clear Powd Provide 1 month supply to thicken all liquids to honey thick consistency.  No refills   simvastatin 10 MG tablet Commonly known as:  ZOCOR Take 10 mg by mouth every evening.   sitaGLIPtin 50 MG tablet Commonly known as:  JANUVIA Take 50 mg by mouth every morning.   terbinafine 250 MG tablet Commonly known as:  LAMISIL Take 250 mg by mouth daily. X 14 days starting 07/16/13   traMADol 50 MG tablet Commonly known as:  ULTRAM Take 1 tablet (50 mg total) by mouth every 6 (six) hours as needed.   Vitamin D 50 MCG (2000 UT) tablet Take 2,000 Units by mouth daily.            Durable Medical Equipment  (From admission, onward)         Start     Ordered   01/08/19 0828  For home use only DME Walker rolling  [facility])  Once    Question:  Patient needs a walker to treat with the following condition  Answer:  CVA (cerebral vascular accident) ([place])   01/08/19 7322          Follow-up Information    [facility]. Schedule an appointment as soon as possible for a visit in 3 week(s).   Specialty:  Neurology Contact information: [address] [place]       Derinda Late, MD. Schedule an appointment as soon as possible for a visit in 1 week(s).   Specialty:  Family Medicine Contact information: [place] [address] [phone]           Major procedures and Radiology Reports -  PLEASE review detailed and final reports thoroughly  -     TTE -  1. The left ventricle has hyperdynamic systolic function, with an ejection fraction of >65%. The cavity size was normal. Left ventricular diastolic function could not be evaluated.  2. The right ventricle has normal systolic function. The cavity was normal. There is no increase in right ventricular wall thickness.  3. The mitral valve is grossly normal. Mild thickening of the  mitral valve leaflet. There is moderate mitral annular calcification present.  4. The aortic valve is tricuspid. Mild thickening of the aortic valve. Mild calcification of the aortic valve.  5. The aortic root is normal in size and structure.  6. The interatrial septum was not assessed.    Ct Angio Head W Or Wo Contrast  Result Date: 01/05/2019 CLINICAL DATA:  Cerebral hemorrhage suspected.  Stroke.  Fall. EXAM: CT ANGIOGRAPHY HEAD AND NECK TECHNIQUE: Multidetector CT imaging of the head and neck was performed using the standard protocol during bolus administration of intravenous contrast. Multiplanar CT image reconstructions and MIPs were obtained to evaluate the vascular anatomy. Carotid stenosis measurements (when applicable) are obtained utilizing NASCET criteria, using the distal internal carotid diameter as the denominator. CONTRAST:  12mL OMNIPAQUE IOHEXOL 350 MG/ML SOLN COMPARISON:  CT head 01/04/2019, MRI 01/05/2019 FINDINGS: CTA NECK FINDINGS Aortic arch: Mild atherosclerotic calcification aortic arch without aneurysm or dissection. Proximal great vessels patent with mild atherosclerotic disease. Right carotid system: Mild atherosclerotic disease right carotid bifurcation without significant stenosis Left carotid system: Mild atherosclerotic disease left carotid bifurcation without significant stenosis. Vertebral arteries: Both vertebral arteries patent to the basilar without significant stenosis. Skeleton: Mild cervical spondylosis.  No acute skeletal abnormality. Other neck: Negative for mass or adenopathy. Upper chest: Lung apices clear bilaterally. Review of the MIP images confirms the above findings CTA HEAD FINDINGS Anterior circulation: Atherosclerotic calcification cavernous carotid bilaterally without significant stenosis. Anterior and middle cerebral arteries patent bilaterally without significant stenosis or occlusion. Posterior circulation: Both vertebral arteries patent to the basilar  without stenosis. PICA patent bilaterally. Basilar widely patent. Right AICA patent. Superior cerebellar and posterior cerebral arteries patent bilaterally without significant stenosis. Fetal origin left posterior cerebral artery. Venous sinuses: Patent Anatomic variants: None Delayed phase: Normal enhancement postcontrast administration. Small 5 mm hyperdensity left parietal periventricular white matter unchanged consistent with a small area of acute hemorrhage. Hypodensity central medulla consistent with acute infarct. New area of hypodensity left medial cerebellum may represent artifact versus interval infarction. Chronic ischemic changes in the white matter and pons. Review of the MIP images confirms the above findings IMPRESSION: 1. Mild atherosclerotic disease carotid bifurcation bilaterally. No significant carotid artery or vertebral artery stenosis in the neck. 2. Negative for intracranial large vessel occlusion 3. Hypodensity central medulla compatible with small acute infarct as noted on MRI. Hypodensity left medial mid cerebellum likely artifact on CT. No acute infarct in this area on recent MRI. Electronically Signed   By: Franchot Gallo M.D.   On: 01/05/2019 10:42   Ct Head Wo Contrast  Result Date: 01/04/2019 CLINICAL DATA:  Recent fall with pain, initial encounter EXAM: CT HEAD WITHOUT CONTRAST CT CERVICAL SPINE WITHOUT CONTRAST TECHNIQUE: Multidetector CT imaging of the head and cervical spine was performed following the standard protocol without intravenous contrast. Multiplanar CT image reconstructions of the cervical spine were also generated. COMPARISON:  None. FINDINGS: CT HEAD FINDINGS Brain: Chronic atrophic and ischemic changes are identified. No findings to suggest acute hemorrhage, acute infarction or space-occupying  mass lesion are noted. Vascular: No hyperdense vessel or unexpected calcification. Skull: Normal. Negative for fracture or focal lesion. Sinuses/Orbits: No acute finding.  Other: None. CT CERVICAL SPINE FINDINGS Alignment: Within normal limits. Skull base and vertebrae: 7 cervical segments are well visualized. Vertebral body height is well maintained. Multilevel facet hypertrophic changes are seen. Mild osteophytic changes are noted as well. No acute fracture or acute facet abnormality is seen. Soft tissues and spinal canal: Surrounding soft tissue structures are within normal limits. Upper chest: Within normal limits. Other: None IMPRESSION: CT of the head: Chronic atrophic and ischemic changes without acute abnormality. CT of cervical spine: Multilevel degenerative change without acute abnormality. Electronically Signed   By: Inez Catalina M.D.   On: 01/04/2019 18:53   Ct Angio Neck W Or Wo Contrast  Result Date: 01/05/2019 CLINICAL DATA:  Cerebral hemorrhage suspected.  Stroke.  Fall. EXAM: CT ANGIOGRAPHY HEAD AND NECK TECHNIQUE: Multidetector CT imaging of the head and neck was performed using the standard protocol during bolus administration of intravenous contrast. Multiplanar CT image reconstructions and MIPs were obtained to evaluate the vascular anatomy. Carotid stenosis measurements (when applicable) are obtained utilizing NASCET criteria, using the distal internal carotid diameter as the denominator. CONTRAST:  53mL OMNIPAQUE IOHEXOL 350 MG/ML SOLN COMPARISON:  CT head 01/04/2019, MRI 01/05/2019 FINDINGS: CTA NECK FINDINGS Aortic arch: Mild atherosclerotic calcification aortic arch without aneurysm or dissection. Proximal great vessels patent with mild atherosclerotic disease. Right carotid system: Mild atherosclerotic disease right carotid bifurcation without significant stenosis Left carotid system: Mild atherosclerotic disease left carotid bifurcation without significant stenosis. Vertebral arteries: Both vertebral arteries patent to the basilar without significant stenosis. Skeleton: Mild cervical spondylosis.  No acute skeletal abnormality. Other neck: Negative for  mass or adenopathy. Upper chest: Lung apices clear bilaterally. Review of the MIP images confirms the above findings CTA HEAD FINDINGS Anterior circulation: Atherosclerotic calcification cavernous carotid bilaterally without significant stenosis. Anterior and middle cerebral arteries patent bilaterally without significant stenosis or occlusion. Posterior circulation: Both vertebral arteries patent to the basilar without stenosis. PICA patent bilaterally. Basilar widely patent. Right AICA patent. Superior cerebellar and posterior cerebral arteries patent bilaterally without significant stenosis. Fetal origin left posterior cerebral artery. Venous sinuses: Patent Anatomic variants: None Delayed phase: Normal enhancement postcontrast administration. Small 5 mm hyperdensity left parietal periventricular white matter unchanged consistent with a small area of acute hemorrhage. Hypodensity central medulla consistent with acute infarct. New area of hypodensity left medial cerebellum may represent artifact versus interval infarction. Chronic ischemic changes in the white matter and pons. Review of the MIP images confirms the above findings IMPRESSION: 1. Mild atherosclerotic disease carotid bifurcation bilaterally. No significant carotid artery or vertebral artery stenosis in the neck. 2. Negative for intracranial large vessel occlusion 3. Hypodensity central medulla compatible with small acute infarct as noted on MRI. Hypodensity left medial mid cerebellum likely artifact on CT. No acute infarct in this area on recent MRI. Electronically Signed   By: Franchot Gallo M.D.   On: 01/05/2019 10:42   Ct Cervical Spine Wo Contrast  Result Date: 01/04/2019 CLINICAL DATA:  Recent fall with pain, initial encounter EXAM: CT HEAD WITHOUT CONTRAST CT CERVICAL SPINE WITHOUT CONTRAST TECHNIQUE: Multidetector CT imaging of the head and cervical spine was performed following the standard protocol without intravenous contrast.  Multiplanar CT image reconstructions of the cervical spine were also generated. COMPARISON:  None. FINDINGS: CT HEAD FINDINGS Brain: Chronic atrophic and ischemic changes are identified. No findings to suggest acute hemorrhage,  acute infarction or space-occupying mass lesion are noted. Vascular: No hyperdense vessel or unexpected calcification. Skull: Normal. Negative for fracture or focal lesion. Sinuses/Orbits: No acute finding. Other: None. CT CERVICAL SPINE FINDINGS Alignment: Within normal limits. Skull base and vertebrae: 7 cervical segments are well visualized. Vertebral body height is well maintained. Multilevel facet hypertrophic changes are seen. Mild osteophytic changes are noted as well. No acute fracture or acute facet abnormality is seen. Soft tissues and spinal canal: Surrounding soft tissue structures are within normal limits. Upper chest: Within normal limits. Other: None IMPRESSION: CT of the head: Chronic atrophic and ischemic changes without acute abnormality. CT of cervical spine: Multilevel degenerative change without acute abnormality. Electronically Signed   By: Inez Catalina M.D.   On: 01/04/2019 18:53   Mr Brain Wo Contrast  Addendum Date: 01/05/2019   ADDENDUM REPORT: 01/05/2019 04:42 ADDENDUM: Findings were communicated by telephone to the covering nurse practitioner [place] at 3:43 a.m. on 01/05/2019. Electronically Signed   By: Jeannine Boga M.D.   On: 01/05/2019 04:42   Result Date: 01/05/2019 CLINICAL DATA:  Initial evaluation for acute TIA, dysarthria. EXAM: MRI HEAD WITHOUT CONTRAST TECHNIQUE: Multiplanar, multiecho pulse sequences of the brain and surrounding structures were obtained without intravenous contrast. COMPARISON:  Prior CT from earlier the same day. FINDINGS: Brain: Diffuse prominence of the CSF containing spaces compatible with generalized age-related cerebral atrophy. Changes are most prominent at the anterior temporal lobes bilaterally. Patchy and  confluent T2/FLAIR hyperintensity within the periventricular and deep white matter both cerebral hemispheres most consistent with chronic microvascular ischemic disease. Chronic microvascular ischemic changes present within the pons as well. 6 mm focus of diffusion abnormality seen within the posterior left corona radiata, consistent with a small acute a infarct (series 5, image 59). Associated susceptibility artifact, consistent with hemorrhage. Small focus of hemorrhage seen within this region on prior CT as well. Layering susceptibility artifact within the occipital horns of the lateral ventricles, left greater than right, suggestive of intraventricular extension, age indeterminate, but could be acute to subacute in nature. No hydrocephalus or ventricular trapping. Additional 5 mm focus of diffusion abnormality involving the subcortical white matter of the high anterior left frontal lobe consistent with an acute to subacute ischemic infarct (series 5, image 67). Additionally, there is a 6 mm focus of linear diffusion abnormality involving the central aspect of the medulla (series 5, image 43). Associated signal loss on ADC map (series 6, image 6). Finding consistent with a small acute to early subacute ischemic infarct. No other evidence for acute or subacute ischemia. Gray-white matter differentiation otherwise maintained. No other areas of remote cortical infarction. No mass lesion, midline shift or mass effect. Trace extra-axial collection measuring 2-3 mm in thickness seen overlying the left occipital convexity, likely a small subdural hematoma, age indeterminate, but could be subacute in nature. This is not well visualized on prior CT. Vascular: Major intracranial vascular flow voids are maintained. Skull and upper cervical spine: Craniocervical junction within normal limits. Upper cervical spine normal. Bone marrow signal intensity within normal limits. Hyperostosis frontalis interna noted. No scalp soft  tissue abnormality. Sinuses/Orbits: Globes and orbital soft tissues demonstrate no acute finding. Patient status post bilateral ocular lens replacement. Paranasal sinuses are clear. No mastoid effusion. Inner ear structures grossly normal. Other: None. IMPRESSION: 1. 6 mm acute hemorrhagic infarct involving the periventricular white matter of the posterior left corona radiata without significant mass effect. Associated trace layering intraventricular hemorrhage somewhat age indeterminate, but could be related to  intraventricular extension. No hydrocephalus or ventricular trapping. 2. Additional 6 mm linear acute to early subacute ischemic nonhemorrhagic infarct involving the central medulla. 3. Additional 6 mm acute to early subacute ischemic nonhemorrhagic infarct involving the subcortical white matter of the anterior left frontal lobe. 4. Trace 2-3 mm subdural collection overlying the occipital convexity, likely blood products. Finding is age indeterminate, but could be acute to subacute in nature. No associated mass effect. 5. Underlying age-related cerebral atrophy with moderate chronic microvascular ischemic disease. A call is in to the ordering clinician. Findings will be communicated as soon as possible. Electronically Signed: By: Jeannine Boga M.D. On: 01/05/2019 03:33   Dg Pelvis Portable  Result Date: 01/04/2019 CLINICAL DATA:  Recent fall with pelvic pain, initial encounter EXAM: PORTABLE PELVIS 1-2 VIEWS COMPARISON:  None. FINDINGS: Left hip replacement is noted. The pelvic ring is intact. Degenerative changes of the lumbar spine are noted. No acute fracture is seen. Postsurgical changes are noted. IMPRESSION: No acute abnormality noted. Electronically Signed   By: Inez Catalina M.D.   On: 01/04/2019 18:29   Dg Chest Portable 1 View  Result Date: 01/04/2019 CLINICAL DATA:  Recent fall with chest pain, initial encounter EXAM: PORTABLE CHEST 1 VIEW COMPARISON:  01/06/18 FINDINGS: Cardiac  shadow is within normal limits and stable. Mild aortic calcifications are again seen. The lungs are well aerated bilaterally. No acute bony abnormality is noted. IMPRESSION: No active disease. Electronically Signed   By: Inez Catalina M.D.   On: 01/04/2019 18:27   Dg Shoulder Left  Result Date: 01/04/2019 CLINICAL DATA:  Recent fall with left shoulder pain, initial encounter EXAM: LEFT SHOULDER - 2+ VIEW COMPARISON:  None. FINDINGS: Degenerative changes of the acromioclavicular joint are noted. No dislocation or fracture is seen. The underlying rib cage is within normal limits. IMPRESSION: No acute abnormality noted. Electronically Signed   By: Inez Catalina M.D.   On: 01/04/2019 18:28   Dg Swallowing Func-speech Pathology  Result Date: 01/06/2019 Objective Swallowing Evaluation: Type of Study: MBS-Modified Barium Swallow Study  Patient Details Name: JEMIAH CUADRA MRN: [id number] Date of Birth: 11-13-1921 Today's Date: 01/06/2019 Time: SLP Start Time (ACUTE ONLY): 19 -SLP Stop Time (ACUTE ONLY): 1325 SLP Time Calculation (min) (ACUTE ONLY): 25 min Past Medical History: Past Medical History: Diagnosis Date  Anemia   Arthritis   Cancer ([place])   hx of thyroid cancer  Colon polyps   Depression   Diabetes mellitus without complication ([place])   Frequency   GERD (gastroesophageal reflux disease)   History of gout   History of skin cancer   Hyperlipidemia   Hypertension   Hypothyroidism   Kidney cysts   followed by Dr. 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 Jearld Fenton every 6 months x 2 yrs  Nocturia   PONV (postoperative nausea and vomiting)   Rash   yeast infection under breasts Past Surgical History: Past Surgical History: Procedure Laterality Date  COLON SURGERY  2004  colon polyps then infection srg x3  HERNIA REPAIR  2005  THYROIDECTOMY  2006  TOTAL HIP ARTHROPLASTY Left 07/20/2013  Procedure: LEFT TOTAL HIP ARTHROPLASTY ANTERIOR APPROACH;  Surgeon: Mcarthur Rossetti, MD;  Location: [facility];  Service: Orthopedics;  Laterality: Left; HPI: Patient is a 85  y.o. female who was independent from home with history of anemia, hypertension, type 2 diabetes, arthritis, hypothyroidism, depression who was admitted last night after she was found in the bathroom by  her daughter.  Patient said that she went to bathroom and lost her balance and fell on the floor and could not get up.  Patient states that the right leg gave up. Admitted from the emergency room with rhabdomyolysis, fall and leukocytosis.  Initial skeletal survey negative.  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  Subjective: pleasant, sitting in chair in radiology suite Assessment / Plan / Recommendation CHL IP CLINICAL IMPRESSIONS 01/06/2019 Clinical Impression Patient presents with a mild oral and a mod-severe pharyngeal dysphagia with signifcantly reduced sensation leading to swallow initiation delays to level of vallecula with puree and regular solids and honey and nectar liquids, and swallow initiation delays to level of pyriform sinus with thin liquids. Patient exhibited mild oral transit delays, but was able to masticate hard solid and transit barium pill/tablet without significant difficulty. Patient aspirated before the swallow with thin liquids and during the swallow with nectar thick liquids and although she did exhibit a cough response, it was not effective to clear aspirate. Patient did not exhibit any penetration or aspiration of honey thick liquids(on MBS imaging slides, at the end of study, SLP mislabeled a nectar liquid trial with 'H' for honey thick liquids). Patient did exhibit vallecular and pyriform sinus residuals with all tested consistencies (minimal for thin liquids, mild-moderate for honey thick, puree, regular solids) but did not exhibit any aspiration after the swallow with these residuals, though cannot r/o risk from this type of event occuring. Chin tuck was not effective to  prevent penetration or aspiration with thin liquids and inconsistently effective with nectar thick liquids.  SLP Visit Diagnosis Dysphagia, oropharyngeal phase (R13.12) Attention and concentration deficit following -- Frontal lobe and executive function deficit following -- Impact on safety and function Severe aspiration risk   CHL IP TREATMENT RECOMMENDATION 01/06/2019 Treatment Recommendations Therapy as outlined in treatment plan below   Prognosis 01/06/2019 Prognosis for Safe Diet Advancement Fair Barriers to Reach Goals Severity of deficits Barriers/Prognosis Comment -- CHL IP DIET RECOMMENDATION 01/06/2019 SLP Diet Recommendations Regular solids;Honey thick liquids Liquid Administration via Cup Medication Administration Whole meds with puree Compensations Slow rate;Small sips/bites Postural Changes Seated upright at 90 degrees   CHL IP OTHER RECOMMENDATIONS 01/06/2019 Recommended Consults -- Oral Care Recommendations Oral care BID Other Recommendations Prohibited food (jello, ice cream, thin soups);Order thickener from pharmacy;Clarify dietary restrictions;Remove water pitcher   CHL IP FOLLOW UP RECOMMENDATIONS 01/06/2019 Follow up Recommendations Home health SLP   CHL IP FREQUENCY AND DURATION 01/06/2019 Speech Therapy Frequency (ACUTE ONLY) min 2x/week Treatment Duration 2 weeks      CHL IP ORAL PHASE 01/06/2019 Oral Phase Impaired Oral - Pudding Teaspoon -- Oral - Pudding Cup -- Oral - Honey Teaspoon -- Oral - Honey Cup -- Oral - Nectar Teaspoon -- Oral - Nectar Cup -- Oral - Nectar Straw -- Oral - Thin Teaspoon Premature spillage Oral - Thin Cup Premature spillage Oral - Thin Straw -- Oral - Puree Weak lingual manipulation;Delayed oral transit Oral - Mech Soft -- Oral - Regular Delayed oral transit;Weak lingual manipulation Oral - Multi-Consistency -- Oral - Pill Delayed oral transit Oral Phase - Comment --  CHL IP PHARYNGEAL PHASE 01/06/2019 Pharyngeal Phase Impaired Pharyngeal- Pudding Teaspoon -- Pharyngeal --  Pharyngeal- Pudding Cup -- Pharyngeal -- Pharyngeal- Honey Teaspoon -- Pharyngeal -- Pharyngeal- Honey Cup -- Pharyngeal -- Pharyngeal- Nectar Teaspoon -- Pharyngeal -- Pharyngeal- Nectar Cup Delayed swallow initiation-vallecula;Delayed swallow initiation-pyriform sinuses;Reduced airway/laryngeal closure;Penetration/Aspiration during swallow;Trace aspiration;Pharyngeal residue - valleculae;Pharyngeal residue - pyriform Pharyngeal Material enters  airway, passes BELOW cords and not ejected out despite cough attempt by patient Pharyngeal- Nectar Straw -- Pharyngeal -- Pharyngeal- Thin Teaspoon Delayed swallow initiation-pyriform sinuses;Reduced airway/laryngeal closure;Penetration/Aspiration before swallow;Penetration/Aspiration during swallow;Pharyngeal residue - valleculae;Pharyngeal residue - pyriform;Trace aspiration Pharyngeal Material enters airway, passes BELOW cords and not ejected out despite cough attempt by patient Pharyngeal- Thin Cup Delayed swallow initiation-pyriform sinuses;Moderate aspiration;Pharyngeal residue - pyriform;Penetration/Aspiration before swallow Pharyngeal Material enters airway, passes BELOW cords and not ejected out despite cough attempt by patient Pharyngeal- Thin Straw -- Pharyngeal -- Pharyngeal- Puree Delayed swallow initiation-vallecula;Pharyngeal residue - valleculae;Pharyngeal residue - pyriform Pharyngeal -- Pharyngeal- Mechanical Soft -- Pharyngeal -- Pharyngeal- Regular Delayed swallow initiation-vallecula;Pharyngeal residue - valleculae;Pharyngeal residue - pyriform Pharyngeal -- Pharyngeal- Multi-consistency -- Pharyngeal -- Pharyngeal- Pill Delayed swallow initiation-vallecula Pharyngeal -- Pharyngeal Comment --  CHL IP CERVICAL ESOPHAGEAL PHASE 01/06/2019 Cervical Esophageal Phase WFL Pudding Teaspoon -- Pudding Cup -- Honey Teaspoon -- Honey Cup -- Nectar Teaspoon -- Nectar Cup -- Nectar Straw -- Thin Teaspoon -- Thin Cup -- Thin Straw -- Puree -- Mechanical Soft --  Regular -- Multi-consistency -- Pill -- Cervical Esophageal Comment -- Sonia Baller, MA, CCC-SLP Speech Therapy [facility]              Vas [place] Lower Extremity Venous (dvt)  Result Date: 01/07/2019  Lower Venous Study Indications: Embolic stroke.  Performing Technologist: Antonieta Pert RDMS, RVT  Examination Guidelines: A complete evaluation includes B-mode imaging, spectral Doppler, color Doppler, and power Doppler as needed of all accessible portions of each vessel. Bilateral testing is considered an integral part of a complete examination. Limited examinations for reoccurring indications may be performed as noted.  Right Venous Findings: +---------+---------------+---------+-----------+----------+-------+            Compressibility Phasicity Spontaneity Properties Summary  +---------+---------------+---------+-----------+----------+-------+  CFV       Full            Yes       Yes                             +---------+---------------+---------+-----------+----------+-------+  SFJ       Full                                                      +---------+---------------+---------+-----------+----------+-------+  FV Prox   Full                                                      +---------+---------------+---------+-----------+----------+-------+  FV Mid    Full                                                      +---------+---------------+---------+-----------+----------+-------+  FV Distal Full                                                      +---------+---------------+---------+-----------+----------+-------+  PFV       Full                                                      +---------+---------------+---------+-----------+----------+-------+  POP       Full            Yes       Yes                             +---------+---------------+---------+-----------+----------+-------+  PTV       Full                                                       +---------+---------------+---------+-----------+----------+-------+  PERO      Full                                                      +---------+---------------+---------+-----------+----------+-------+  GSV       Full                                                      +---------+---------------+---------+-----------+----------+-------+  Left Venous Findings: +---------+---------------+---------+-----------+----------+-------+            Compressibility Phasicity Spontaneity Properties Summary  +---------+---------------+---------+-----------+----------+-------+  CFV       Full            Yes       Yes                             +---------+---------------+---------+-----------+----------+-------+  SFJ       Full                                                      +---------+---------------+---------+-----------+----------+-------+  FV Prox   Full                                                      +---------+---------------+---------+-----------+----------+-------+  FV Mid    Full                                                      +---------+---------------+---------+-----------+----------+-------+  FV Distal Full                                                      +---------+---------------+---------+-----------+----------+-------+  PFV       Full                                                      +---------+---------------+---------+-----------+----------+-------+  POP       Full            Yes       Yes                             +---------+---------------+---------+-----------+----------+-------+  PTV       Full                                                      +---------+---------------+---------+-----------+----------+-------+  PERO      Full                                                      +---------+---------------+---------+-----------+----------+-------+  GSV       Full                                                       +---------+---------------+---------+-----------+----------+-------+    Summary: Right: There is no evidence of deep vein thrombosis in the lower extremity. No cystic structure found in the popliteal fossa. Atherosclerotic changes seen throughout extremity. Left: There is no evidence of deep vein thrombosis in the lower extremity. No cystic structure found in the popliteal fossa. Atherosclerotic changes seen throughout extremity.  *See table(s) above for measurements and observations. Electronically signed by Deitra Mayo MD on 01/07/2019 at 6:44:58 AM.    Final     Micro Results     Recent Results (from the past 240 hour(s))  Gram stain     Status: None   Collection Time: 01/04/19  7:38 PM  Result Value Ref Range Status   Specimen Description URINE, CLEAN CATCH  Final   Special Requests NONE  Final   Gram Stain   Final    WBC PRESENT, PREDOMINANTLY PMN GRAM NEGATIVE RODS GRAM POSITIVE COCCI CYTOSPIN SMEAR Performed at [facility], [place] [address], [place], [place] [address]    Report Status 01/04/2019 FINAL  Final  Urine culture     Status: None   Collection Time: 01/04/19  7:38 PM  Result Value Ref Range Status   Specimen Description URINE, CLEAN CATCH  Final   Special Requests NONE  Final   Culture   Final    NO GROWTH Performed at [facility], [place] [address] [place], [place] [address]    Report Status 01/05/2019 FINAL  Final    Today   Subjective    Lauren Rose today has no headache,no chest abdominal pain,no new weakness tingling or numbness, feels much better wants to go home today.     Objective   Blood pressure Marland Kitchen)  154/51, pulse 77, temperature 98.5 F (36.9 C), temperature source Oral, resp. rate 18, height 5\' 4"  (1.626 m), weight 59 kg, SpO2 96 %.   Intake/Output Summary (Last 24 hours) at 01/08/2019 0828 Last data filed at 01/07/2019 1642 Gross per 24 hour  Intake 380 ml  Output --  Net 380 ml    Exam Awake Alert, Oriented x 2, No new  F.N deficits, mild R facial droop,  Normal affect Martin.AT,PERRAL Supple Neck,No JVD, No cervical lymphadenopathy appriciated.  Symmetrical Chest wall movement, Good air movement bilaterally, CTAB RRR,No Gallops,Rubs or new Murmurs, No Parasternal Heave +ve B.Sounds, Abd Soft, Non tender, No organomegaly appriciated, No rebound -guarding or rigidity. No Cyanosis, Clubbing or edema, No new Rash or bruise   Data Review   CBC w Diff:  Lab Results  Component Value Date   WBC 13.4 (H) 01/05/2019   HGB 10.7 (L) 01/05/2019   HCT 32.3 (L) 01/05/2019   PLT 268 01/05/2019   LYMPHOPCT 3 01/04/2019   MONOPCT 5 01/04/2019   EOSPCT 1 01/04/2019   BASOPCT 0 01/04/2019    CMP:  Lab Results  Component Value Date   NA 136 01/05/2019   K 3.7 01/07/2019   CL 99 01/05/2019   CO2 24 01/05/2019   BUN 15 01/05/2019   CREATININE 0.92 01/05/2019   PROT 7.4 01/04/2019   ALBUMIN 3.5 01/04/2019   BILITOT 0.7 01/04/2019   ALKPHOS 57 01/04/2019   AST 42 (H) 01/04/2019   ALT 24 01/04/2019  .  Lab Results  Component Value Date   HGBA1C 6.2 (H) 01/04/2019   Lab Results  Component Value Date   CHOL 114 01/06/2019   HDL 46 01/06/2019   LDLCALC 48 01/06/2019   TRIG 99 01/06/2019   CHOLHDL 2.5 01/06/2019    Total Time in preparing paper work, data evaluation and todays exam - 74 minutes  Lala Lund M.D on 01/08/2019 at 8:28 AM  [facility]   Office  [phone]

## 2019-01-08 NOTE — Discharge Instructions (Signed)
Follow with Primary MD Derinda Late, MD in 7 days   Get CBC, CMP, 2 view Chest X ray -  checked  by Primary MD or SNF MD in 5-7 days   Activity: As tolerated with Full fall precautions use walker/cane & assistance as needed  Disposition Home or SNF if patient agrees  Diet: Heart Healthy low carbohydrate with honey thick liquids, with feeding assistance and aspiration precautions.    Special Instructions: If you have smoked or chewed Tobacco  in the last 2 yrs please stop smoking, stop any regular Alcohol  and or any Recreational drug use.  On your next visit with your primary care physician please Get Medicines reviewed and adjusted.  Please request your Prim.MD to go over all Hospital Tests and Procedure/Radiological results at the follow up, please get all Hospital records sent to your Prim MD by signing hospital release before you go home.  If you experience worsening of your admission symptoms, develop shortness of breath, life threatening emergency, suicidal or homicidal thoughts you must seek medical attention immediately by calling 911 or calling your MD immediately  if symptoms less severe.  You Must read complete instructions/literature along with all the possible adverse reactions/side effects for all the Medicines you take and that have been prescribed to you. Take any new Medicines after you have completely understood and accpet all the possible adverse reactions/side effects.

## 2019-01-08 NOTE — Progress Notes (Signed)
Thank you for consult on Lauren Rose. Chart and therapy notes reviewed. Patient lives alone and was minimally active at home. Agree with recommendations for SNF after discharge. Will defer consult for now.

## 2019-01-08 NOTE — Progress Notes (Signed)
  Speech Language Pathology Treatment: Dysphagia  Patient Details Name: Lauren Rose MRN: [id number] DOB: 1922/10/06 Today's Date: 01/08/2019 Time: 1005-1100 SLP Time Calculation (min) (ACUTE ONLY): 55 min  Assessment / Plan / Recommendation Clinical Impression  Extensive f/u with pt and phone education with daughter, Lauren Rose, re: nature of her mother's deficits, results of MBS, risk of aspiration, necessity of current diet with honey thick liquids.  D/C orders have been written and pt is awaiting D/C to SNF.  [name] had difficulty masticating regular solids on breakfast tray.  She verbalizes concerns re: difficulty chewing and swallowing.  Demonstrated improved control of purees followed by honey thick liquids with verbal cues needed to slow rate, minimize quantity, and clear throat occasionally between meals.  Downgraded diet to Dys2, honey.   Per MBS, pt demonstrated cough response with aspiration - f/u SLP at SNF may be able to advance diet per clinical assessment without repeating MBS.    Will follow pending D/C.   HPI HPI: Patient is a 85  y.o. female who was independent from home with history of anemia, hypertension, type 2 diabetes, arthritis, hypothyroidism, depression who was admitted last night after she was found in the bathroom by her daughter.  Patient said that she went to bathroom and lost her balance and fell on the floor and could not get up.  Patient states that the right leg gave up. Admitted from the emergency room with rhabdomyolysis, fall and leukocytosis.  Initial skeletal survey negative.  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      SLP Plan  Continue with current plan of care       Recommendations  Diet recommendations: Dysphagia 2 (fine chop);Honey-thick liquid Liquids provided via: Cup Medication Administration: Crushed with puree Compensations: Slow rate;Small sips/bites                Oral Care Recommendations: Oral care BID Follow up  Recommendations: Skilled Nursing facility SLP Visit Diagnosis: Dysphagia, oropharyngeal phase (R13.12) Plan: Continue with current plan of care       GO                Lauren Rose 01/08/2019, 11:08 AM  Lauren Rose, [place] Office number [phone] Pager [phone]

## 2019-01-16 ENCOUNTER — Telehealth: Payer: Self-pay | Admitting: Radiology

## 2019-01-16 NOTE — Telephone Encounter (Signed)
After multiple attempts to reach patient were unsuccessful I reached out to her daughter. Patient is in a rehab facility currently. Patents daughter is gonna call the facility and will call me back about wearing the monitor no or waiting till patient is out of rehab.

## 2019-01-17 NOTE — Telephone Encounter (Signed)
  Daughter is calling back to say that it is okay to mail monitor to facility.   [facility] [address] [place], [place] [address] [phone]

## 2019-01-17 NOTE — Telephone Encounter (Signed)
Enrolled patient in [employer] for a 30 day event monitor. Monitor is being mailed to rehab facility.

## 2019-01-28 ENCOUNTER — Inpatient Hospital Stay (HOSPITAL_COMMUNITY)
Admission: EM | Admit: 2019-01-28 | Discharge: 2019-02-01 | DRG: 683 | Disposition: A | Discharge status: Hospice | Payer: Medicare Other | Source: Skilled Nursing Facility | Attending: Internal Medicine | Admitting: Internal Medicine

## 2019-01-28 ENCOUNTER — Other Ambulatory Visit: Payer: Self-pay

## 2019-01-28 ENCOUNTER — Encounter (HOSPITAL_COMMUNITY): Payer: Self-pay

## 2019-01-28 DIAGNOSIS — E039 Hypothyroidism, unspecified: Secondary | ICD-10-CM | POA: Diagnosis present

## 2019-01-28 DIAGNOSIS — R001 Bradycardia, unspecified: Secondary | ICD-10-CM | POA: Diagnosis present

## 2019-01-28 DIAGNOSIS — Z79899 Other long term (current) drug therapy: Secondary | ICD-10-CM

## 2019-01-28 DIAGNOSIS — E785 Hyperlipidemia, unspecified: Secondary | ICD-10-CM | POA: Diagnosis present

## 2019-01-28 DIAGNOSIS — Z96642 Presence of left artificial hip joint: Secondary | ICD-10-CM | POA: Diagnosis present

## 2019-01-28 DIAGNOSIS — Z79890 Hormone replacement therapy (postmenopausal): Secondary | ICD-10-CM

## 2019-01-28 DIAGNOSIS — Z66 Do not resuscitate: Secondary | ICD-10-CM | POA: Diagnosis present

## 2019-01-28 DIAGNOSIS — E46 Unspecified protein-calorie malnutrition: Secondary | ICD-10-CM | POA: Diagnosis present

## 2019-01-28 DIAGNOSIS — R7401 Elevation of levels of liver transaminase levels: Secondary | ICD-10-CM

## 2019-01-28 DIAGNOSIS — E871 Hypo-osmolality and hyponatremia: Secondary | ICD-10-CM | POA: Diagnosis present

## 2019-01-28 DIAGNOSIS — I10 Essential (primary) hypertension: Secondary | ICD-10-CM | POA: Diagnosis present

## 2019-01-28 DIAGNOSIS — N179 Acute kidney failure, unspecified: Secondary | ICD-10-CM | POA: Diagnosis not present

## 2019-01-28 DIAGNOSIS — E119 Type 2 diabetes mellitus without complications: Secondary | ICD-10-CM | POA: Diagnosis not present

## 2019-01-28 DIAGNOSIS — R0902 Hypoxemia: Secondary | ICD-10-CM

## 2019-01-28 DIAGNOSIS — F411 Generalized anxiety disorder: Secondary | ICD-10-CM | POA: Diagnosis present

## 2019-01-28 DIAGNOSIS — E890 Postprocedural hypothyroidism: Secondary | ICD-10-CM | POA: Diagnosis present

## 2019-01-28 DIAGNOSIS — R740 Nonspecific elevation of levels of transaminase and lactic acid dehydrogenase [LDH]: Secondary | ICD-10-CM

## 2019-01-28 DIAGNOSIS — G4710 Hypersomnia, unspecified: Secondary | ICD-10-CM | POA: Diagnosis present

## 2019-01-28 DIAGNOSIS — Z8673 Personal history of transient ischemic attack (TIA), and cerebral infarction without residual deficits: Secondary | ICD-10-CM

## 2019-01-28 DIAGNOSIS — Z85828 Personal history of other malignant neoplasm of skin: Secondary | ICD-10-CM

## 2019-01-28 DIAGNOSIS — R5381 Other malaise: Secondary | ICD-10-CM | POA: Diagnosis present

## 2019-01-28 DIAGNOSIS — K219 Gastro-esophageal reflux disease without esophagitis: Secondary | ICD-10-CM | POA: Diagnosis present

## 2019-01-28 DIAGNOSIS — Z7982 Long term (current) use of aspirin: Secondary | ICD-10-CM

## 2019-01-28 DIAGNOSIS — N939 Abnormal uterine and vaginal bleeding, unspecified: Secondary | ICD-10-CM | POA: Diagnosis present

## 2019-01-28 DIAGNOSIS — Z6822 Body mass index (BMI) 22.0-22.9, adult: Secondary | ICD-10-CM

## 2019-01-28 DIAGNOSIS — I959 Hypotension, unspecified: Secondary | ICD-10-CM | POA: Diagnosis present

## 2019-01-28 DIAGNOSIS — Z85850 Personal history of malignant neoplasm of thyroid: Secondary | ICD-10-CM

## 2019-01-28 DIAGNOSIS — E872 Acidosis: Secondary | ICD-10-CM | POA: Diagnosis present

## 2019-01-28 LAB — BASIC METABOLIC PANEL
Anion gap: 12 (ref 5–15)
BUN: 84 mg/dL — ABNORMAL HIGH (ref 8–23)
CO2: 21 mmol/L — ABNORMAL LOW (ref 22–32)
Calcium: 8.7 mg/dL — ABNORMAL LOW (ref 8.9–10.3)
Chloride: 111 mmol/L (ref 98–111)
Creatinine, Ser: 2.26 mg/dL — ABNORMAL HIGH (ref 0.44–1.00)
GFR calc Af Amer: 21 mL/min — ABNORMAL LOW (ref >60)
GFR calc non Af Amer: 18 mL/min — ABNORMAL LOW (ref >60)
Glucose, Bld: 163 mg/dL — ABNORMAL HIGH (ref 70–99)
Potassium: 4.8 mmol/L (ref 3.5–5.1)
Sodium: 144 mmol/L (ref 135–145)

## 2019-01-28 LAB — CBC WITH DIFFERENTIAL/PLATELET
Abs Immature Granulocytes: 0.07 10*3/uL (ref 0.00–0.07)
Basophils Absolute: 0.1 10*3/uL (ref 0.0–0.1)
Basophils Relative: 1 %
Eosinophils Absolute: 0.4 10*3/uL (ref 0.0–0.5)
Eosinophils Relative: 6 %
HCT: 42.6 % (ref 36.0–46.0)
Hemoglobin: 12.8 g/dL (ref 12.0–15.0)
Immature Granulocytes: 1 %
Lymphocytes Relative: 29 %
Lymphs Abs: 2 10*3/uL (ref 0.7–4.0)
MCH: 27.7 pg (ref 26.0–34.0)
MCHC: 30 g/dL (ref 30.0–36.0)
MCV: 92.2 fL (ref 80.0–100.0)
Monocytes Absolute: 0.7 10*3/uL (ref 0.1–1.0)
Monocytes Relative: 10 %
Neutro Abs: 3.7 10*3/uL (ref 1.7–7.7)
Neutrophils Relative %: 53 %
Platelets: 437 10*3/uL — ABNORMAL HIGH (ref 150–400)
RBC: 4.62 MIL/uL (ref 3.87–5.11)
RDW: 14.8 % (ref 11.5–15.5)
WBC: 6.9 10*3/uL (ref 4.0–10.5)
nRBC: 0 % (ref 0.0–0.2)

## 2019-01-28 LAB — MAGNESIUM: Magnesium: 2.8 mg/dL — ABNORMAL HIGH (ref 1.7–2.4)

## 2019-01-28 LAB — GLUCOSE, CAPILLARY: Glucose-Capillary: 108 mg/dL — ABNORMAL HIGH (ref 70–99)

## 2019-01-28 LAB — CK: Total CK: 153 U/L (ref 38–234)

## 2019-01-28 MED ORDER — PAROXETINE HCL 10 MG PO TABS
10.0000 mg | ORAL_TABLET | Freq: Every morning | ORAL | Status: DC
  Administered 2019-01-29: 10 mg via ORAL
  Filled 2019-01-28 (×2): qty 1

## 2019-01-28 MED ORDER — FERROUS SULFATE 325 (65 FE) MG PO TABS
325.0000 mg | ORAL_TABLET | Freq: Every day | ORAL | Status: DC
  Administered 2019-01-29: 325 mg via ORAL
  Filled 2019-01-28: qty 1

## 2019-01-28 MED ORDER — ACETAMINOPHEN 325 MG PO TABS: 650.0000 mg | ORAL_TABLET | Freq: Four times a day (QID) | ORAL | Status: DC | PRN

## 2019-01-28 MED ORDER — PANTOPRAZOLE SODIUM 40 MG PO TBEC
40.0000 mg | DELAYED_RELEASE_TABLET | Freq: Every day | ORAL | Status: DC
  Administered 2019-01-29: 40 mg via ORAL
  Filled 2019-01-28: qty 1

## 2019-01-28 MED ORDER — SODIUM CHLORIDE 0.9 % IV SOLN
Freq: Once | INTRAVENOUS | Status: AC
  Administered 2019-01-28: 18:00:00 via INTRAVENOUS

## 2019-01-28 MED ORDER — LEVOTHYROXINE SODIUM 125 MCG PO TABS
125.0000 ug | ORAL_TABLET | Freq: Every day | ORAL | Status: DC
  Administered 2019-01-29 – 2019-01-30 (×2): 125 ug via ORAL
  Filled 2019-01-28 (×4): qty 1

## 2019-01-28 MED ORDER — ASPIRIN EC 325 MG PO TBEC
325.0000 mg | DELAYED_RELEASE_TABLET | Freq: Every day | ORAL | Status: DC
  Administered 2019-01-29: 325 mg via ORAL
  Filled 2019-01-28: qty 1

## 2019-01-28 MED ORDER — HEPARIN SODIUM (PORCINE) 5000 UNIT/ML IJ SOLN: 5000.0000 [IU] | Freq: Three times a day (TID) | INTRAMUSCULAR | Status: DC

## 2019-01-28 MED ORDER — ADULT MULTIVITAMIN W/MINERALS CH
1.0000 | ORAL_TABLET | Freq: Every day | ORAL | Status: DC
  Administered 2019-01-29: 1 via ORAL
  Filled 2019-01-28: qty 1

## 2019-01-28 MED ORDER — SODIUM CHLORIDE 0.9 % IV BOLUS
250.0000 mL | Freq: Once | INTRAVENOUS | Status: AC
  Administered 2019-01-28: 250 mL via INTRAVENOUS

## 2019-01-28 MED ORDER — SIMVASTATIN 10 MG PO TABS
10.0000 mg | ORAL_TABLET | Freq: Every evening | ORAL | Status: DC
  Administered 2019-01-28: 10 mg via ORAL
  Filled 2019-01-28 (×2): qty 1

## 2019-01-28 MED ORDER — DOCUSATE SODIUM 100 MG PO CAPS
100.0000 mg | ORAL_CAPSULE | Freq: Every day | ORAL | Status: DC
  Administered 2019-01-29: 100 mg via ORAL
  Filled 2019-01-28: qty 1

## 2019-01-28 MED ORDER — INSULIN ASPART 100 UNIT/ML ~~LOC~~ SOLN
0.0000 [IU] | Freq: Three times a day (TID) | SUBCUTANEOUS | Status: DC
  Administered 2019-01-29 (×2): 2 [IU] via SUBCUTANEOUS
  Administered 2019-01-29: 1 [IU] via SUBCUTANEOUS
  Administered 2019-01-30: 2 [IU] via SUBCUTANEOUS
  Administered 2019-01-30: 1 [IU] via SUBCUTANEOUS
  Administered 2019-01-31: 2 [IU] via SUBCUTANEOUS

## 2019-01-28 MED ORDER — SODIUM CHLORIDE 0.9 % IV SOLN
INTRAVENOUS | Status: DC
  Administered 2019-01-28 – 2019-01-30 (×3): via INTRAVENOUS

## 2019-01-28 MED ORDER — ACETAMINOPHEN 650 MG RE SUPP: 650.0000 mg | Freq: Four times a day (QID) | RECTAL | Status: DC | PRN

## 2019-01-28 MED ORDER — CALCIUM CARBONATE-VITAMIN D 500-200 MG-UNIT PO TABS
1.0000 | ORAL_TABLET | Freq: Every day | ORAL | Status: DC
  Administered 2019-01-29: 1 via ORAL
  Filled 2019-01-28: qty 1

## 2019-01-28 NOTE — ED Provider Notes (Signed)
[facility] DEPT Provider Note   CSN: [id number] Arrival date & time: 01/28/19  1508    History   Chief Complaint No chief complaint on file.   HPI Lauren Rose is a 85 y.o. female.     HPI Patient is sent from [facility] home for vaginal bleeding.  Details were not provided by the nursing home.  I did contact the patient's daughter who reports that intermittently over the past year or so patient would report some blood after wiping.  This would resolve on its own and was usually attributed to being a hemorrhoid.  Patient's daughter does report that patient had a polyp resected from her colon several years ago that ended up having a complicated course and requiring surgical intervention.  She has however not been having ongoing problems since that time.  She reports that the patient also has a pessary that gets removed about every 3 to 4 months and replaced by her GYN.  She has not previously had any problems with bleeding from it.  The patient denies that she is any pain.  She denies abdominal pain or vaginal pain.  She is not able to appreciate that she is bleeding.  I did review with the patient's daughter the finding of acute kidney injury.  Her daughter reports that the patient drinks very little fluids because she is on a thickened liquid diet and easily becomes strangled even on foods with the consistency of tomato juice.  He reports that the nursing home often has to encourage her to eat and drink.  She nonetheless is usually resistant to trying to drink the thickened liquids.  Her daughter reports that even prior to all this she never liked drinking water and considered it more like trying to take medicine. Past Medical History:  Diagnosis Date  . Anemia   . Arthritis   . Cancer (HCC)    hx of thyroid cancer  . Colon polyps   . Depression   . Diabetes mellitus without complication ([place])   . Frequency   . GERD (gastroesophageal  reflux disease)   . History of gout   . History of skin cancer   . Hyperlipidemia   . Hypertension   . Hypothyroidism   . Kidney cysts    followed by Dr. Diona Fanti  . 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 Jearld Fenton every 6 months x 2 yrs  . Nocturia   . PONV (postoperative nausea and vomiting)   . Rash    yeast infection under breasts    Patient Active Problem List   Diagnosis Date Noted  . Acute ischemic stroke ([place]) 01/05/2019  . Rhabdomyolysis 01/04/2019  . Essential hypertension 01/04/2019  . Diabetes ([place]) 01/04/2019  . Hypothyroidism 01/04/2019  . Emesis 01/04/2019  . Unwitnessed fall 01/04/2019  . Degenerative arthritis of hip, left 07/20/2013    Past Surgical History:  Procedure Laterality Date  . COLON SURGERY  2004   colon polyps then infection srg x3  . HERNIA REPAIR  2005  . THYROIDECTOMY  2006  . TOTAL HIP ARTHROPLASTY Left 07/20/2013   Procedure: LEFT TOTAL HIP ARTHROPLASTY ANTERIOR APPROACH;  Surgeon: Mcarthur Rossetti, MD;  Location: [facility];  Service: Orthopedics;  Laterality: Left;     OB History   No obstetric history on file.      Home Medications    Prior to Admission medications   Medication Sig Start Date End Date  Taking? Authorizing Provider  ALPRAZolam (XANAX) 0.25 MG tablet Take 0.5-1 tablets (0.125-0.25 mg total) by mouth daily as needed for anxiety or sleep. 01/08/19  Yes Thurnell Lose, MD  amLODipine (NORVASC) 10 MG tablet Take 10 mg by mouth every morning.   Yes [provider]  aspirin 325 MG EC tablet Take 1 tablet (325 mg total) by mouth daily with breakfast. 01/08/19  Yes Thurnell Lose, MD  atenolol (TENORMIN) 25 MG tablet Take 12.5 mg by mouth 2 (two) times daily.   Yes [provider]  calcium-vitamin D (OSCAL WITH D) 500-200 MG-UNIT per tablet Take 1 tablet by mouth daily with breakfast.   Yes [provider]  docusate sodium (COLACE) 100 MG capsule Take 100 mg by mouth  daily.   Yes [provider]  estradiol (ESTRACE) 0.1 MG/GM vaginal cream Place 2 g vaginally 2 (two) times a week.   Yes [provider]  etodolac (LODINE) 200 MG capsule Take 200 mg by mouth every morning.   Yes [provider]  ferrous sulfate 325 (65 FE) MG tablet Take 325 mg by mouth daily with breakfast.   Yes [provider]  hydrochlorothiazide (HYDRODIURIL) 25 MG tablet Take 25 mg by mouth every morning.   Yes [provider]  ipratropium-albuterol (DUONEB) 0.5-2.5 (3) MG/3ML SOLN Take 3 mLs by nebulization every 6 (six) hours as needed (wheezing and sob).   Yes [provider]  levothyroxine (SYNTHROID, LEVOTHROID) 125 MCG tablet Take 125 mcg by mouth daily before breakfast.   Yes [provider]  lisinopril (ZESTRIL) 10 MG tablet Take 10 mg by mouth daily.   Yes [provider]  Multiple Vitamin (MULTIVITAMIN WITH MINERALS) TABS tablet Take 1 tablet by mouth daily.   Yes [provider]  omeprazole (PRILOSEC) 20 MG capsule Take 20 mg by mouth daily.   Yes [provider]  PARoxetine (PAXIL) 10 MG tablet Take 10 mg by mouth every morning.   Yes [provider]  polyethylene glycol (MIRALAX / GLYCOLAX) 17 g packet Take 17 g by mouth daily as needed for mild constipation.   Yes [provider]  simvastatin (ZOCOR) 10 MG tablet Take 10 mg by mouth every evening.   Yes [provider]  sitaGLIPtin (JANUVIA) 50 MG tablet Take 50 mg by mouth every morning.   Yes [provider]  losartan (COZAAR) 100 MG tablet Take 1 tablet (100 mg total) by mouth every morning. 01/10/19   Thurnell Lose, MD  Maltodextrin-Xanthan Gum (RESOURCE THICKENUP CLEAR) POWD Provide 1 month supply to thicken all liquids to honey thick consistency.  No refills Patient not taking: Reported on 01/28/2019 01/08/19   Thurnell Lose, MD  traMADol (ULTRAM) 50 MG tablet Take 1 tablet (50 mg total) by  mouth every 6 (six) hours as needed. Patient not taking: Reported on 01/05/2019 07/23/13   Pete Pelt, PA-C    Family History No family history on file.  Social History Social History   Tobacco Use  . Smoking status: Never Smoker  . Smokeless tobacco: Never Used  Substance Use Topics  . Alcohol use: No  . Drug use: No     Allergies   Patient has no known allergies.   Review of Systems Review of Systems 10 Systems reviewed and are negative for acute change except as noted in the HPI.  Physical Exam Updated Vital Signs BP 124/73 (BP Location: Left Arm)   Pulse (!) 54   Temp  98 F (36.7 C) (Oral)   Resp 20   Ht 5\' 4"  (1.626 m)   Wt 58.9 kg   SpO2 97%   BMI 22.29 kg/m   Physical Exam Constitutional:      Comments: Patient is alert and interactive.  No respiratory distress.  HENT:     Head: Normocephalic and atraumatic.     Mouth/Throat:     Mouth: Mucous membranes are dry.  Eyes:     Extraocular Movements: Extraocular movements intact.  Cardiovascular:     Comments: Bradycardia occasional ectopy Pulmonary:     Effort: Pulmonary effort is normal.     Breath sounds: Normal breath sounds.  Abdominal:     General: There is no distension.     Palpations: Abdomen is soft.     Tenderness: There is no abdominal tenderness. There is no guarding.  Genitourinary:    Comments: The vulva is grossly normal in appearance.  Patient does however have small focal varicosities that appear friable.  Digital exam of the vaginal introitus reveals a pessary to be in the vaginal canal.  The pessary is mobile.  There is no blood present from digital exam and palpation of the pessary.  The rectal area has many small nonthrombosed hemorrhoidal tags.  None actively bleeding or thrombosed.  Digital rectal exam is for green soft stool.  No melena.  No frank blood. Musculoskeletal:     Comments: Patient has normal range of motion but restricted due to chronic arthritis.  No significant  peripheral edema.  Calves are soft and symmetric.  Skin:    General: Skin is warm and dry.  Neurological:     Comments: Patient is alert and interactive.  Her voice is slightly gravelly and difficult to understand but generally content is normal.  Patient has fair recall for medical history and is situationally aware.  She assists in following commands to the best of her physical capacity.  Psychiatric:        Mood and Affect: Mood normal.      ED Treatments / Results  Labs (all labs ordered are listed, but only abnormal results are displayed) Labs Reviewed  BASIC METABOLIC PANEL - Abnormal; Notable for the following components:      Result Value   CO2 21 (*)    Glucose, Bld 163 (*)    BUN 84 (*)    Creatinine, Ser 2.26 (*)    Calcium 8.7 (*)    GFR calc non Af Amer 18 (*)    GFR calc Af Amer 21 (*)    All other components within normal limits  CBC WITH DIFFERENTIAL/PLATELET - Abnormal; Notable for the following components:   Platelets 437 (*)    All other components within normal limits  URINE CULTURE  URINALYSIS, ROUTINE W REFLEX MICROSCOPIC    EKG None  Radiology No results found.  Procedures Procedures (including critical care time)  Medications Ordered in ED Medications  sodium chloride 0.9 % bolus 250 mL (has no administration in time range)  0.9 %  sodium chloride infusion (has no administration in time range)     Initial Impression / Assessment and Plan / ED Course  I have reviewed the triage vital signs and the nursing notes.  Pertinent labs & imaging results that were available during my care of the patient were reviewed by me and considered in my medical decision making (see chart for details).        Consent for vaginal bleeding.  On exam there  is no blood present in the vaginal introitus.  No blood in the rectum.  Green stool.  Patient has superficial varicosities of the vulva which might bleed periodically.  In the course of evaluation however  patient does have significant AKI that is new.  Likely dehydration associated per discussion with patient's daughter.  Patient had fairly recent stroke and has been put on a thickened liquid diet.  She reports she is not eating much.  Patient does not appear toxic.  She is alert.  We will plan to admit for evaluation and treatment for AKI.  Final Clinical Impressions(s) / ED Diagnoses   Final diagnoses:  Acute kidney injury [facility])    ED Discharge Orders    None       Charlesetta Shanks, MD 01/29/19 [phone]

## 2019-01-28 NOTE — ED Triage Notes (Signed)
Patient presented to ed with c/o vaginal bleeding started this morning. Patient coming from a nursing home [facility].

## 2019-01-28 NOTE — ED Notes (Addendum)
ED TO INPATIENT HANDOFF REPORT  Name/Age/Gender Lauren Rose 85 y.o. female  Code Status    Code Status Orders  (From admission, onward)         Start     Ordered   01/28/19 1858  Do not attempt resuscitation (DNR)  Continuous    Question Answer Comment  In the event of cardiac or respiratory ARREST Do not call a "code blue"   In the event of cardiac or respiratory ARREST Do not perform Intubation, CPR, defibrillation or ACLS   In the event of cardiac or respiratory ARREST Use medication by any route, position, wound care, and other measures to relive pain and suffering. May use oxygen, suction and manual treatment of airway obstruction as needed for comfort.      01/28/19 1858        Code Status History    Date Active Date Inactive Code Status Order ID Comments User Context   01/04/2019 2226 01/08/2019 1824 DNR [id number]  Mercy Riding, MD ED   07/20/2013 1730 07/24/2013 1916 Full Code [id number]  Mcarthur Rossetti, MD Inpatient      Home/SNF/Other Home  Chief Complaint Vaginal bleeding  Level of Care/Admitting Diagnosis ED Disposition    ED Disposition Condition [facility]: [facility] [[id number]]  Level of Care: Telemetry [5]  Admit to tele based on following criteria: Complex arrhythmia (Bradycardia/Tachycardia)  Covid Evaluation: N/A  Diagnosis: AKI (acute kidney injury) [facility]) [[id number]]  Admitting Physician: Rhetta Mura [[id number]]  Attending Physician: Rhetta Mura [[id number]]  PT Class (Do Not Modify): Observation [104]  PT Acc Code (Do Not Modify): Observation [10022]       Medical History Past Medical History:  Diagnosis Date  . Anemia   . Arthritis   . Cancer (HCC)    hx of thyroid cancer  . Colon polyps   . Depression   . Diabetes mellitus without complication ([place])   . Frequency   . GERD (gastroesophageal reflux disease)   . History of gout   . History of skin cancer   . Hyperlipidemia   .  Hypertension   . Hypothyroidism   . Kidney cysts    followed by Dr. Diona Fanti  . 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 Jearld Fenton every 6 months x 2 yrs  . Nocturia   . PONV (postoperative nausea and vomiting)   . Rash    yeast infection under breasts    Allergies No Known Allergies  IV Location/Drains/Wounds Patient Lines/Drains/Airways Status   Active Line/Drains/Airways    Name:   Placement date:   Placement time:   Site:   Days:   Peripheral IV 01/28/19 Right Antecubital   01/28/19    1644    Antecubital   less than 1   Airway   07/20/13    1355     2018   Incision 07/20/13 Hip Left   07/20/13    1445     2018          Labs/Imaging Results for orders placed or performed during the hospital encounter of 01/28/19 (from the past 48 hour(s))  Basic metabolic panel     Status: Abnormal   Collection Time: 01/28/19  4:43 PM  Result Value Ref Range   Sodium 144 135 - 145 mmol/L   Potassium 4.8 3.5 - 5.1 mmol/L   Chloride 111 98 - 111 mmol/L   CO2 21 (  L) 22 - 32 mmol/L   Glucose, Bld 163 (H) 70 - 99 mg/dL   BUN 84 (H) 8 - 23 mg/dL   Creatinine, Ser 2.26 (H) 0.44 - 1.00 mg/dL   Calcium 8.7 (L) 8.9 - 10.3 mg/dL   GFR calc non Af Amer 18 (L) >60 mL/min   GFR calc Af Amer 21 (L) >60 mL/min   Anion gap 12 5 - 15    Comment: Performed at [facility], [place] [address] [place], [place] [address]  CBC with Differential     Status: Abnormal   Collection Time: 01/28/19  4:43 PM  Result Value Ref Range   WBC 6.9 4.0 - 10.5 K/uL   RBC 4.62 3.87 - 5.11 MIL/uL   Hemoglobin 12.8 12.0 - 15.0 g/dL   HCT 42.6 36.0 - 46.0 %   MCV 92.2 80.0 - 100.0 fL   MCH 27.7 26.0 - 34.0 pg   MCHC 30.0 30.0 - 36.0 g/dL   RDW 14.8 11.5 - 15.5 %   Platelets 437 (H) 150 - 400 K/uL   nRBC 0.0 0.0 - 0.2 %   Neutrophils Relative % 53 %   Neutro Abs 3.7 1.7 - 7.7 K/uL   Lymphocytes Relative 29 %   Lymphs Abs 2.0 0.7 - 4.0 K/uL   Monocytes Relative 10 %    Monocytes Absolute 0.7 0.1 - 1.0 K/uL   Eosinophils Relative 6 %   Eosinophils Absolute 0.4 0.0 - 0.5 K/uL   Basophils Relative 1 %   Basophils Absolute 0.1 0.0 - 0.1 K/uL   Immature Granulocytes 1 %   Abs Immature Granulocytes 0.07 0.00 - 0.07 K/uL    Comment: Performed at [facility], [place] [address] [place],  [address]   No results found.  Pending Labs Unresulted Labs (From admission, onward)    Start     Ordered   01/29/19 0500  Comprehensive metabolic panel  Tomorrow morning,   R     01/28/19 1901   01/29/19 0500  CBC  Tomorrow morning,   R     01/28/19 1901   01/29/19 0500  Magnesium  Tomorrow morning,   R     01/28/19 1901   01/29/19 0500  Prealbumin  Tomorrow morning,   R     01/28/19 1926   01/28/19 1905  CK  Add-on,   R     01/28/19 1904   01/28/19 1901  Magnesium  Add-on,   R     01/28/19 1900   01/28/19 1900  Sodium, urine, random  Once,   R     01/28/19 1859   01/28/19 1900  Creatinine, urine, random  Once,   R     01/28/19 1859   01/28/19 1809  Urinalysis, Routine w reflex microscopic  ONCE - STAT,   STAT     01/28/19 1808   01/28/19 1809  Urine culture  ONCE - STAT,   STAT     01/28/19 1808          Vitals/Pain Today's Vitals   01/28/19 1930 01/28/19 1943 01/28/19 2001 01/28/19 2030  BP: (!) 118/41  (!) 109/40 (!) 101/37  Pulse: (!) 55 (!) 57 (!) 59 61  Resp: (!) 21 19 20 20   Temp:      TempSrc:      SpO2: 99% 98% 100% 97%  Weight:      Height:      PainSc:  Isolation Precautions No active isolations  Medications Medications  0.9 %  sodium chloride infusion (has no administration in time range)  acetaminophen (TYLENOL) tablet 650 mg (has no administration in time range)    Or  acetaminophen (TYLENOL) suppository 650 mg (has no administration in time range)  insulin aspart (novoLOG) injection 0-9 Units (has no administration in time range)  aspirin EC tablet 325 mg (has no administration in time range)   calcium-vitamin D (OSCAL WITH D) 500-200 MG-UNIT per tablet 1 tablet (has no administration in time range)  docusate sodium (COLACE) capsule 100 mg (has no administration in time range)  ferrous sulfate tablet 325 mg (has no administration in time range)  levothyroxine (SYNTHROID) tablet 125 mcg (has no administration in time range)  multivitamin with minerals tablet 1 tablet (has no administration in time range)  pantoprazole (PROTONIX) EC tablet 40 mg (has no administration in time range)  PARoxetine (PAXIL) tablet 10 mg (has no administration in time range)  simvastatin (ZOCOR) tablet 10 mg (has no administration in time range)  sodium chloride 0.9 % bolus 250 mL (250 mLs Intravenous New Bag/Given 01/28/19 1828)  0.9 %  sodium chloride infusion ( Intravenous New Bag/Given 01/28/19 1828)    Mobility

## 2019-01-28 NOTE — ED Notes (Signed)
Bed: WA20 Expected date:  Expected time:  Means of arrival:  Comments: 85 yo vaginal bleeding

## 2019-01-28 NOTE — H&P (Signed)
History and Physical    PLEASE NOTE THAT DRAGON DICTATION SOFTWARE WAS USED IN THE CONSTRUCTION OF THIS NOTE.   Lauren Rose XBD:[id number] DOB: 1922-02-28 DOA: 01/28/2019  PCP: Derinda Late, MD Patient coming from: [facility] SN[facility]  I have personally briefly reviewed patient's old medical records in [place]  Chief Complaint: vulvar bleeding  HPI: Lauren Rose is a 85 y.o. female with medical history significant for hypertension, type 2 diabetes mellitus, ischemic CVA, acquired hypothyroidism, who is admitted to [facility] on 01/28/2019 with acute kidney injury after presenting from [facility] to [facility] ED for evaluation of vulvar bleeding.   Nursing at patient's SNF were reportedly concerned regarding small amount of dried blood that they encountered in the patient's diaper starting earlier today, which they reportedly felt was vaginal or vulvar in nature. No report of any associated melena or hematochezia. Patient herself unaware of any bleeding, and denies any dysuria or urinary urgency/frequency. She also denies any chest pain, shortness of breath, diaphoresis, palpitations, dizziness, presyncope, or syncope.  Denies subjective fever, chills, or rigors.  Denies any recent rhinorrhea, sore throat, or cough. No known or suspected COVID-19 exposures. In the setting of March 2020 hospitalization for acute ischemic CVA, the patient is on a daily full dose aspirin, but is otherwise on no blood thinning agents.  Outpatient medication list is also notable for lisinopril as well as scheduled Etodoalc.   Following the aforementioned hospitalization in March 2020, the patient reports diminished oral intake in the setting of a decline in appetite over that time. Denies any associated N/V/D. She reports baseline poor intake of water for several years, and feel like this has further declined over the weeks following her discharge from the hospital.     ED Course: Vital signs in the  ED notable for the following: Temperature max 98.0; heart rate 48-54; initial blood pressure 124/73, which increased to 145/72 following interval IV fluids, as further described below; respiratory rate 20-21, and oxygen saturation 97 100% on room air.  Notable labs performed in the ED include the following: BMP notable for sodium 144, potassium 4.8, bicarbonate 21, anion gap 12, BUN 84, creatinine 2.26 compared to 0.92 on 01/05/2019; CBC notable for white blood cell count of 6900, hemoglobin 12.8 compared to 10.7 on 01/05/2019, platelets 437.  Urinalysis was ordered, but with result pending at this time.   GU exam performed by ED physician reported revealed friable, small focal varicosities on vulvar exam in absence of any active bleeding and no evidence of bleed in the vaginal introitus. Additionally, DRE revealed no evidence of frank blood.  While still in the ED, the patient received a total of 50 cc IV normal saline bolus, followed by maintenance normal saline at 125 cc/h. Subsequently, the patient was admitted to the med-tele floor for further evaluation and management of acute kidney injury.     Review of Systems: As per HPI otherwise 10 point review of systems negative.   Past Medical History:  Diagnosis Date  . Anemia   . Arthritis   . Cancer (HCC)    hx of thyroid cancer  . Colon polyps   . Depression   . Diabetes mellitus without complication ([place])   . Frequency   . GERD (gastroesophageal reflux disease)   . History of gout   . History of skin cancer   . Hyperlipidemia   . Hypertension   . Hypothyroidism   . Kidney cysts    followed by  Dr. Diona Fanti  . 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 Jearld Fenton every 6 months x 2 yrs  . Nocturia   . PONV (postoperative nausea and vomiting)   . Rash    yeast infection under breasts    Past Surgical History:  Procedure Laterality Date  . COLON SURGERY  2004   colon polyps then infection srg x3  . HERNIA REPAIR   2005  . THYROIDECTOMY  2006  . TOTAL HIP ARTHROPLASTY Left 07/20/2013   Procedure: LEFT TOTAL HIP ARTHROPLASTY ANTERIOR APPROACH;  Surgeon: Mcarthur Rossetti, MD;  Location: [facility] ORS;  Service: Orthopedics;  Laterality: Left;    Social History:  reports that she has never smoked. She has never used smokeless tobacco. She reports that she does not drink alcohol or use drugs.   No Known Allergies   Family History: Family history reviewed and not pertinent.    Prior to Admission medications   Medication Sig Start Date End Date Taking? Authorizing Provider  ALPRAZolam (XANAX) 0.25 MG tablet Take 0.5-1 tablets (0.125-0.25 mg total) by mouth daily as needed for anxiety or sleep. 01/08/19  Yes Thurnell Lose, MD  amLODipine (NORVASC) 10 MG tablet Take 10 mg by mouth every morning.   Yes [provider]  aspirin 325 MG EC tablet Take 1 tablet (325 mg total) by mouth daily with breakfast. 01/08/19  Yes Thurnell Lose, MD  atenolol (TENORMIN) 25 MG tablet Take 12.5 mg by mouth 2 (two) times daily.   Yes [provider]  calcium-vitamin D (OSCAL WITH D) 500-200 MG-UNIT per tablet Take 1 tablet by mouth daily with breakfast.   Yes [provider]  docusate sodium (COLACE) 100 MG capsule Take 100 mg by mouth daily.   Yes [provider]  estradiol (ESTRACE) 0.1 MG/GM vaginal cream Place 2 g vaginally 2 (two) times a week.   Yes [provider]  etodolac (LODINE) 200 MG capsule Take 200 mg by mouth every morning.   Yes [provider]  ferrous sulfate 325 (65 FE) MG tablet Take 325 mg by mouth daily with breakfast.   Yes [provider]  hydrochlorothiazide (HYDRODIURIL) 25 MG tablet Take 25 mg by mouth every morning.   Yes [provider]  ipratropium-albuterol (DUONEB) 0.5-2.5 (3) MG/3ML SOLN Take 3 mLs by nebulization every 6 (six) hours as needed (wheezing and sob).   Yes [provider]  levothyroxine  (SYNTHROID, LEVOTHROID) 125 MCG tablet Take 125 mcg by mouth daily before breakfast.   Yes [provider]  lisinopril (ZESTRIL) 10 MG tablet Take 10 mg by mouth daily.   Yes [provider]  Multiple Vitamin (MULTIVITAMIN WITH MINERALS) TABS tablet Take 1 tablet by mouth daily.   Yes [provider]  omeprazole (PRILOSEC) 20 MG capsule Take 20 mg by mouth daily.   Yes [provider]  PARoxetine (PAXIL) 10 MG tablet Take 10 mg by mouth every morning.   Yes [provider]  polyethylene glycol (MIRALAX / GLYCOLAX) 17 g packet Take 17 g by mouth daily as needed for mild constipation.   Yes [provider]  simvastatin (ZOCOR) 10 MG tablet Take 10 mg by mouth every evening.   Yes [provider]  sitaGLIPtin (JANUVIA) 50 MG tablet Take 50 mg by mouth every morning.   Yes [provider]  losartan (COZAAR) 100 MG tablet Take 1 tablet (100 mg total) by mouth every morning. 01/10/19  Thurnell Lose, MD  Maltodextrin-Xanthan Gum (RESOURCE THICKENUP CLEAR) POWD Provide 1 month supply to thicken all liquids to honey thick consistency.  No refills Patient not taking: Reported on 01/28/2019 01/08/19   Thurnell Lose, MD  traMADol (ULTRAM) 50 MG tablet Take 1 tablet (50 mg total) by mouth every 6 (six) hours as needed. Patient not taking: Reported on 01/05/2019 07/23/13   Pete Pelt, PA-C     Objective     Physical Exam: Vitals:   01/28/19 1537 01/28/19 1541 01/28/19 1724  BP:  (!) 127/38 124/73  Pulse:  (!) 48 (!) 54  Resp:  (!) 28 20  Temp:  98 F (36.7 C)   TempSrc:  Oral   SpO2:  100% 97%  Weight: 58.9 kg    Height: 5\' 4"  (1.626 m)      General: appears to be stated age; alert; oriented to place and self; thinks that it is currently May 2020, and is unsure of the identity of the current [place] president Skin: warm, dry, no rash Head:  AT/[place] Mouth:  Oral mucosa membranes appear dry, normal dentition Neck:  supple; trachea midline Heart:  RRR; did not appreciate any M/R/G Lungs: CTAB, did not appreciate any wheezes, rales, or rhonchi Abdomen: + BS; soft, ND, NT Extremities: no peripheral edema, no muscle wasting   Labs on Admission: I have personally reviewed following labs and imaging studies  CBC: Recent Labs  Lab 01/28/19 1643  WBC 6.9  NEUTROABS 3.7  HGB 12.8  HCT 42.6  MCV 92.2  PLT 924*   Basic Metabolic Panel: Recent Labs  Lab 01/28/19 1643  NA 144  K 4.8  CL 111  CO2 21*  GLUCOSE 163*  BUN 84*  CREATININE 2.26*  CALCIUM 8.7*   GFR: Estimated Creatinine Clearance: 12.9 mL/min (A) (by C-G formula based on SCr of 2.26 mg/dL (H)). Liver Function Tests: No results for input(s): AST, ALT, ALKPHOS, BILITOT, PROT, ALBUMIN in the last 168 hours. No results for input(s): LIPASE, AMYLASE in the last 168 hours. No results for input(s): AMMONIA in the last 168 hours. Coagulation Profile: No results for input(s): INR, PROTIME in the last 168 hours. Cardiac Enzymes: No results for input(s): CKTOTAL, CKMB, CKMBINDEX, TROPONINI in the last 168 hours. BNP (last 3 results) No results for input(s): PROBNP in the last 8760 hours. HbA1C: No results for input(s): HGBA1C in the last 72 hours. CBG: No results for input(s): GLUCAP in the last 168 hours. Lipid Profile: No results for input(s): CHOL, HDL, LDLCALC, TRIG, CHOLHDL, LDLDIRECT in the last 72 hours. Thyroid Function Tests: No results for input(s): TSH, T4TOTAL, FREET4, T3FREE, THYROIDAB in the last 72 hours. Anemia Panel: No results for input(s): VITAMINB12, FOLATE, FERRITIN, TIBC, IRON, RETICCTPCT in the last 72 hours. Urine analysis:    Component Value Date/Time   COLORURINE YELLOW 01/04/2019 1930   APPEARANCEUR HAZY (A) 01/04/2019 1930   LABSPEC 1.013 01/04/2019 1930   PHURINE 6.0 01/04/2019 1930   GLUCOSEU NEGATIVE 01/04/2019 1930   HGBUR SMALL (A) 01/04/2019 1930   BILIRUBINUR NEGATIVE 01/04/2019 1930    KETONESUR NEGATIVE 01/04/2019 1930   PROTEINUR 100 (A) 01/04/2019 1930   UROBILINOGEN 0.2 07/12/2013 1353   NITRITE NEGATIVE 01/04/2019 1930   LEUKOCYTESUR NEGATIVE 01/04/2019 1930    Radiological Exams on Admission: No results found.   Assessment/Plan    IRIEL NASON is a 85 y.o. female with medical history significant for hypertension, type 2 diabetes mellitus, ischemic CVA, acquired hypothyroidism, who  is admitted to [facility] on 01/28/2019 with acute kidney injury after presenting from [facility] to [facility] ED for evaluation of vulvar bleeding.    Principal Problem:   AKI (acute kidney injury) ([place]) Active Problems:   Essential hypertension   Diabetes ([place])   Hypothyroidism   Bradycardia   GERD (gastroesophageal reflux disease)   #) Acute Kidney Injury: Presenting creatinine noted to be 2.26 relative to most recent prior value of 0.92 on 01/05/2019; appears to be prerenal in nature with BUN/Cr of 37, with contribution from intravascular depletion stemming dehydration from diminished oral intake as well as potential pharmacologic contribution from concomitant use of lisinopril and scheduled etodolac.  Urinalysis is currently pending.   Plan: We will monitor for results of urinalysis.  We will also check random urine sodium as well as random urine creatinine.  Also check CPK, given documented history of rhabdomyolysis.  Monitor strict I's and O's.  Attempt to avoid nephrotoxic agents.  Repeat BMP in the morning.  Hold home lisinopril as well as etodolac.      #) Protein Calorie Malnutrition: suspected given patient's report of diminished oral intake due to diminished appetite following March 2020 hospitalization, in taken with decline in weight over that time. Patient's daughter commiserates patient's report of decline in oral intake.   Plan: check pre-albumin. Nutrition consult placed for the morning.     #) Bradycardia: mild bradycardia with HR of 48-54 in the ED,  which, on telemetry appears to be sinus in nature. Currently normotensive and appears asymptomatic from a cardiac standpoint, including no report of recent chest pain. Her baseline heart rate is not currently clear to me, but it is noted that the patient is atenolol as an outpatient. Most recent TSH was found to be WNL at 0.736 when checked on 01/07/19.   Plan: EKG x 1 now. Monitor on telemetry. Add-on serum magnesium level to labs collected in the ED. Hold home atenolol.      #) Acquired hypothyroidism: on Synthroid as an outpatient. As noted above, most recent TSH was found to be WNL when checked at the end of March 2020, and the patient denies any ensuing dose adjustments to her Synthroid following this.  Plan: continue home Synthroid.      #) Hypertension: outpatient anti-hypertensive regimen includes HCTZ, Norvasc, atenolol, and lisinopril. Per JNC 8 guidelines, goal systolic BP in this patient with h/o DM2 would be less than 140 mmHg.   Plan: will hold home BP meds for now, and closely monitor ensuing BP's via routine VS. Also holding lisinopril in setting of presenting AKI.      #) Type 2 Diabetes Mellitus: appears well controlled as outpatient with most recent A1c found to be 6.2% when checked on 01/04/19. On Januvia as outpatient, but no exogenous insulin. Blood sugar per presenting bmp noted to 163.   Plan: hold outpatient Januiva during this hospitalization. Monitor accuchecks qac and hs with associated SSI.      #) Generalized Anxiety Disorder: on Paxil and prn Xanax an outpatient.   Plan: continue Paxil, but will hold prn Xanax during this hospitalization.     DVT prophylaxis: scd's Code Status: DNR. Family Communication: (none) Disposition Plan:  Per Rounding Team Consults called: (none)  Admission status: observation; med-tele.    PLEASE NOTE THAT DRAGON DICTATION SOFTWARE WAS USED IN THE CONSTRUCTION OF THIS NOTE.   [facility]  Pager [phone] From 3PM- 11PM.   Otherwise, please contact night-coverage  [email or web address] Password [facility]  01/28/2019, 6:27 PM

## 2019-01-28 NOTE — ED Notes (Signed)
Attempted to call floor for report. RN will call back.

## 2019-01-28 NOTE — ED Notes (Signed)
Attempted to call report for the second time. RN is still unavailable. Spoke with charge nurse and she is not able to take report either due to having patients.

## 2019-01-29 DIAGNOSIS — E785 Hyperlipidemia, unspecified: Secondary | ICD-10-CM | POA: Diagnosis present

## 2019-01-29 DIAGNOSIS — Z6822 Body mass index (BMI) 22.0-22.9, adult: Secondary | ICD-10-CM | POA: Diagnosis not present

## 2019-01-29 DIAGNOSIS — E119 Type 2 diabetes mellitus without complications: Secondary | ICD-10-CM | POA: Diagnosis present

## 2019-01-29 DIAGNOSIS — Z515 Encounter for palliative care: Secondary | ICD-10-CM | POA: Diagnosis not present

## 2019-01-29 DIAGNOSIS — E890 Postprocedural hypothyroidism: Secondary | ICD-10-CM

## 2019-01-29 DIAGNOSIS — K219 Gastro-esophageal reflux disease without esophagitis: Secondary | ICD-10-CM | POA: Diagnosis present

## 2019-01-29 DIAGNOSIS — I10 Essential (primary) hypertension: Secondary | ICD-10-CM | POA: Diagnosis present

## 2019-01-29 DIAGNOSIS — N179 Acute kidney failure, unspecified: Secondary | ICD-10-CM | POA: Diagnosis present

## 2019-01-29 DIAGNOSIS — I959 Hypotension, unspecified: Secondary | ICD-10-CM | POA: Diagnosis present

## 2019-01-29 DIAGNOSIS — R001 Bradycardia, unspecified: Secondary | ICD-10-CM | POA: Diagnosis present

## 2019-01-29 DIAGNOSIS — Z8673 Personal history of transient ischemic attack (TIA), and cerebral infarction without residual deficits: Secondary | ICD-10-CM | POA: Diagnosis not present

## 2019-01-29 DIAGNOSIS — Z79899 Other long term (current) drug therapy: Secondary | ICD-10-CM | POA: Diagnosis not present

## 2019-01-29 DIAGNOSIS — G4710 Hypersomnia, unspecified: Secondary | ICD-10-CM | POA: Diagnosis present

## 2019-01-29 DIAGNOSIS — Z96642 Presence of left artificial hip joint: Secondary | ICD-10-CM | POA: Diagnosis present

## 2019-01-29 DIAGNOSIS — E46 Unspecified protein-calorie malnutrition: Secondary | ICD-10-CM | POA: Diagnosis present

## 2019-01-29 DIAGNOSIS — E872 Acidosis: Secondary | ICD-10-CM | POA: Diagnosis present

## 2019-01-29 DIAGNOSIS — N939 Abnormal uterine and vaginal bleeding, unspecified: Secondary | ICD-10-CM | POA: Diagnosis present

## 2019-01-29 DIAGNOSIS — E871 Hypo-osmolality and hyponatremia: Secondary | ICD-10-CM | POA: Diagnosis present

## 2019-01-29 DIAGNOSIS — Z7189 Other specified counseling: Secondary | ICD-10-CM | POA: Diagnosis not present

## 2019-01-29 DIAGNOSIS — Z79890 Hormone replacement therapy (postmenopausal): Secondary | ICD-10-CM | POA: Diagnosis not present

## 2019-01-29 DIAGNOSIS — R5381 Other malaise: Secondary | ICD-10-CM | POA: Diagnosis present

## 2019-01-29 DIAGNOSIS — Z85850 Personal history of malignant neoplasm of thyroid: Secondary | ICD-10-CM | POA: Diagnosis not present

## 2019-01-29 DIAGNOSIS — F411 Generalized anxiety disorder: Secondary | ICD-10-CM | POA: Diagnosis present

## 2019-01-29 DIAGNOSIS — Z7982 Long term (current) use of aspirin: Secondary | ICD-10-CM | POA: Diagnosis not present

## 2019-01-29 DIAGNOSIS — Z66 Do not resuscitate: Secondary | ICD-10-CM | POA: Diagnosis present

## 2019-01-29 DIAGNOSIS — Z85828 Personal history of other malignant neoplasm of skin: Secondary | ICD-10-CM | POA: Diagnosis not present

## 2019-01-29 DIAGNOSIS — R0902 Hypoxemia: Secondary | ICD-10-CM | POA: Diagnosis not present

## 2019-01-29 LAB — COMPREHENSIVE METABOLIC PANEL
ALT: 120 U/L — ABNORMAL HIGH (ref 0–44)
AST: 80 U/L — ABNORMAL HIGH (ref 15–41)
Albumin: 2.8 g/dL — ABNORMAL LOW (ref 3.5–5.0)
Alkaline Phosphatase: 223 U/L — ABNORMAL HIGH (ref 38–126)
Anion gap: 10 (ref 5–15)
BUN: 69 mg/dL — ABNORMAL HIGH (ref 8–23)
CO2: 18 mmol/L — ABNORMAL LOW (ref 22–32)
Calcium: 8.3 mg/dL — ABNORMAL LOW (ref 8.9–10.3)
Chloride: 117 mmol/L — ABNORMAL HIGH (ref 98–111)
Creatinine, Ser: 1.57 mg/dL — ABNORMAL HIGH (ref 0.44–1.00)
GFR calc Af Amer: 32 mL/min — ABNORMAL LOW (ref >60)
GFR calc non Af Amer: 28 mL/min — ABNORMAL LOW (ref >60)
Glucose, Bld: 143 mg/dL — ABNORMAL HIGH (ref 70–99)
Potassium: 5.1 mmol/L (ref 3.5–5.1)
Sodium: 145 mmol/L (ref 135–145)
Total Bilirubin: 0.4 mg/dL (ref 0.3–1.2)
Total Protein: 6.5 g/dL (ref 6.5–8.1)

## 2019-01-29 LAB — CBC
HCT: 37.7 % (ref 36.0–46.0)
Hemoglobin: 11.2 g/dL — ABNORMAL LOW (ref 12.0–15.0)
MCH: 27.5 pg (ref 26.0–34.0)
MCHC: 29.7 g/dL — ABNORMAL LOW (ref 30.0–36.0)
MCV: 92.4 fL (ref 80.0–100.0)
Platelets: 380 10*3/uL (ref 150–400)
RBC: 4.08 MIL/uL (ref 3.87–5.11)
RDW: 14.8 % (ref 11.5–15.5)
WBC: 7.6 10*3/uL (ref 4.0–10.5)
nRBC: 0 % (ref 0.0–0.2)

## 2019-01-29 LAB — GLUCOSE, CAPILLARY
Glucose-Capillary: 104 mg/dL — ABNORMAL HIGH (ref 70–99)
Glucose-Capillary: 126 mg/dL — ABNORMAL HIGH (ref 70–99)
Glucose-Capillary: 153 mg/dL — ABNORMAL HIGH (ref 70–99)

## 2019-01-29 LAB — PREALBUMIN: Prealbumin: 17.2 mg/dL — ABNORMAL LOW (ref 18–38)

## 2019-01-29 LAB — MAGNESIUM: Magnesium: 2.6 mg/dL — ABNORMAL HIGH (ref 1.7–2.4)

## 2019-01-29 MED ORDER — AMLODIPINE BESYLATE 10 MG PO TABS
10.0000 mg | ORAL_TABLET | Freq: Every day | ORAL | Status: DC
  Filled 2019-01-29: qty 1

## 2019-01-29 NOTE — Progress Notes (Signed)
[facility]  PROGRESS NOTE  GISSELLA Rose WVP:[id number] DOB: 06/12/1922 DOA: 01/28/2019 PCP: Derinda Late, MD   Brief HPI:   85 year old female with a history of hypertension, type 2 diabetes mellitus, ischemic CVA, acquired hypothyroidism who was admitted to hospital with acute kidney injury.  Patient initially presented with vulvar bleeding.  She has a history of multiple bleeding, has a pessary in place which is replaced by GYN every 3 to 4 months.     Subjective   This morning patient appears lethargic, denies any chest pain or shortness of breath.   Assessment/Plan:     1. Acute kidney injury-likely from poor p.o. intake, patient's baseline creatinine was 0.92 in March 2020.  Patient presented with creatinine of 2.26.Patient also was on lisinopril and etodolac.  Urine sodium and creatinine are currently pending.  Continue IV normal saline at 75 mL/h.  2. Vaginal/vulvar bleeding-patient had speculum examination done by ED physician yesterday which did not show any active bleeding in the vagina.  She did have superficial varicosities in the vulva which is likely source of bleeding.  3. Protein calorie malnutrition/poor p.o. intake-patient recently had stroke and has had a poor p.o. intake since then.  Nutrition consulted.  Will obtain speech therapy evaluation.  4. Bradycardia-patient's heart rate was in 48-54 in the ED.  Atenolol was held yesterday, heart rate has improved to 60s.  We will continue to monitor.  Blood pressure is stable.  5. Hypothyroidism-continue Synthroid.  6. Hypertension-atenolol is on hold.  HCTZ, lisinopril are on hold due to worsening renal function.  Atenolol is also on hold as above.  Will restart Norvasc.  7. Diabetes mellitus type 2-continue sliding scale insulin with NovoLog.  Januvia on hold.     CBG: Recent Labs  Lab 01/28/19 2127 01/29/19 0738 01/29/19 1224  GLUCAP 108* 126* 153*    CBC: Recent Labs  Lab 01/28/19 1643  01/29/19 0423  WBC 6.9 7.6  NEUTROABS 3.7  --   HGB 12.8 11.2*  HCT 42.6 37.7  MCV 92.2 92.4  PLT 437* 546    Basic Metabolic Panel: Recent Labs  Lab 01/28/19 1643 01/29/19 0423  NA 144 145  K 4.8 5.1  CL 111 117*  CO2 21* 18*  GLUCOSE 163* 143*  BUN 84* 69*  CREATININE 2.26* 1.57*  CALCIUM 8.7* 8.3*  MG 2.8* 2.6*     DVT prophylaxis: SCDs  Code Status: DNR  Family Communication: No family at bedside  Disposition Plan: likely home when medically ready for discharge     Consultants:    Procedures:     Antibiotics:   Anti-infectives (From admission, onward)   None       Objective   Vitals:   01/28/19 2030 01/28/19 2118 01/29/19 0618 01/29/19 1227  BP: (!) 101/37 (!) 145/54 (!) 153/55 (!) 146/63  Pulse: 61 66 72 63  Resp: 20 (!) 40  14  Temp:  98.3 F (36.8 C)  (!) 97.5 F (36.4 C)  TempSrc:  Oral  Oral  SpO2: 97% 96%  95%  Weight:      Height:        Intake/Output Summary (Last 24 hours) at 01/29/2019 1606 Last data filed at 01/29/2019 0909 Gross per 24 hour  Intake 20 ml  Output -  Net 20 ml   Filed Weights   01/28/19 1537  Weight: 58.9 kg     Physical Examination:    General: Appears   lethargic  Cardiovascular: S1-S2, regular, no murmur  auscultated  Respiratory: Clear to auscultation bilaterally  Abdomen: Abdomen is soft, nontender, no organomegaly  Extremities: No edema in the lower extremities  Neurologic: Alert, lethargic, tries to communicate but hard to understand     Data Reviewed: I have personally reviewed following labs and imaging studies   No results found for this or any previous visit (from the past 240 hour(s)).   Liver Function Tests: Recent Labs  Lab 01/29/19 0423  AST 80*  ALT 120*  ALKPHOS 223*  BILITOT 0.4  PROT 6.5  ALBUMIN 2.8*   No results for input(s): LIPASE, AMYLASE in the last 168 hours. No results for input(s): AMMONIA in the last 168 hours.  Cardiac Enzymes: Recent  Labs  Lab 01/28/19 1643  CKTOTAL 153   BNP (last 3 results) No results for input(s): BNP in the last 8760 hours.  ProBNP (last 3 results) No results for input(s): PROBNP in the last 8760 hours.    Studies: No results found.  Scheduled Meds: . aspirin  325 mg Oral Q breakfast  . calcium-vitamin D  1 tablet Oral Q breakfast  . docusate sodium  100 mg Oral Daily  . ferrous sulfate  325 mg Oral Q breakfast  . insulin aspart  0-9 Units Subcutaneous TID WC  . levothyroxine  125 mcg Oral QAC breakfast  . multivitamin with minerals  1 tablet Oral Daily  . pantoprazole  40 mg Oral Daily  . PARoxetine  10 mg Oral q morning - 10a  . simvastatin  10 mg Oral QPM    Admission status: Inpatient: Based on patients clinical presentation and evaluation of above clinical data, I have made determination that patient meets Inpatient criteria at this time.  Time spent: 20 min  [facility] Pager [phone]. If 7PM-7AM, please contact night-coverage at [email or web address], Office  [phone]  password [email or web address]  01/29/2019, 4:06 PM  LOS: 0 days

## 2019-01-29 NOTE — Progress Notes (Signed)
Spoke with patient daughter, Lauren Rose who stated that patient has a pessary inserted in her vagina that gets changed every 4 months.  It was last changed on 1/21.  Patient daughter would like to speak with doctor tomorrow. 4/21

## 2019-01-29 NOTE — Progress Notes (Signed)
Attempted 4 times by 3 nurses to collect urinalysis/culture.  Unable to collect due to patient anatomy.  MD made aware.

## 2019-01-29 NOTE — Progress Notes (Signed)
Initial Nutrition Assessment  INTERVENTION:   -Provide Vital Cuisine shake daily, provides 520 kcal and 22g protein(nectar thick when refrigerated). -Provide Magic cup BID with meals, each supplement provides 290 kcal and 9 grams of protein  -Recommend SLP evaluation to see if diet and fluid consistencies need adjustment.  NUTRITION DIAGNOSIS:   Inadequate oral intake related to poor appetite as evidenced by meal completion < 25%.  GOAL:   Patient will meet greater than or equal to 90% of their needs  MONITOR:   PO intake, Supplement acceptance, Labs, Weight trends, I & O's  REASON FOR ASSESSMENT:   Consult Assessment of nutrition requirement/status  ASSESSMENT:    85 y.o. female with medical history significant for hypertension, type 2 diabetes mellitus, ischemic CVA, acquired hypothyroidism, who is admitted to [facility] on 01/28/2019 with acute kidney injury after presenting from [facility] SNF to [facility] ED for evaluation of vulvar bleeding. Pt admitted to the med-tele floor for further evaluation and management of acute kidney injury.  **RD working remotely**  Patient was on a GI soft diet with nectar thick liquids. Pt consumed 25% of breakfast of oatmeal and bites of applesauce. Pt with history of recent stroke.Pt was recently admitted in March 2020 and SLP evaluated, recommended dysphagia 2 diet with honey thick liquids as of 3/30. Pt was following this diet at facility. Per nursing home staff, pt needs consistent encouragement to eat and drink, does not seem to like thickened liquids. Pt has had poor PO intake since discharge from previous admission 3/30.  RD downgraded diet to dysphagia 2. Recommend SLP evaluation this admission. RD ordered Magic cups for meals and a Vital Cuisine shake once daily for added nutrition.  Per weight records, no weights available prior to March 2020 since 2014.  Medications: OSCAL-D tablet daily, Ferrous sulfate tablet daily,  Multivitamin with minerals daily Labs reviewed: CBGs: 108-126  Elevated Mg  GFR: 28  NUTRITION - FOCUSED PHYSICAL EXAM:  Unable to perform per department requirements to work remotely.  Diet Order:   Diet Order            DIET DYS 2 Room service appropriate? Yes; Fluid consistency: Nectar Thick  Diet effective now              EDUCATION NEEDS:   Not appropriate for education at this time  Skin:  Skin Assessment: Reviewed RN Assessment  Last BM:  PTA  Height:   Ht Readings from Last 1 Encounters:  01/28/19 5\' 4"  (1.626 m)    Weight:   Wt Readings from Last 1 Encounters:  01/28/19 58.9 kg    Ideal Body Weight:  54.5 kg  BMI:  Body mass index is 22.29 kg/m.  Estimated Nutritional Needs:   Kcal:  2836-6294  Protein:  65-75g  Fluid:  1.6L/day  Clayton Bibles, MS, RD, LDN [place] [facility] Pager: [phone] After Hours Pager: [phone]

## 2019-01-30 ENCOUNTER — Inpatient Hospital Stay (HOSPITAL_COMMUNITY): Payer: Medicare Other

## 2019-01-30 DIAGNOSIS — R740 Nonspecific elevation of levels of transaminase and lactic acid dehydrogenase [LDH]: Secondary | ICD-10-CM

## 2019-01-30 LAB — URINALYSIS, ROUTINE W REFLEX MICROSCOPIC
Bilirubin Urine: NEGATIVE
Glucose, UA: NEGATIVE mg/dL
Ketones, ur: 5 mg/dL — AB
Nitrite: NEGATIVE
Protein, ur: >=300 mg/dL — AB
RBC / HPF: >50 RBC/hpf — ABNORMAL HIGH (ref 0–5)
Specific Gravity, Urine: 1.01 (ref 1.005–1.030)
WBC, UA: >50 WBC/hpf — ABNORMAL HIGH (ref 0–5)
pH: 9 — ABNORMAL HIGH (ref 5.0–8.0)

## 2019-01-30 LAB — CBC
HCT: 35.9 % — ABNORMAL LOW (ref 36.0–46.0)
Hemoglobin: 10.8 g/dL — ABNORMAL LOW (ref 12.0–15.0)
MCH: 28.3 pg (ref 26.0–34.0)
MCHC: 30.1 g/dL (ref 30.0–36.0)
MCV: 94 fL (ref 80.0–100.0)
Platelets: 366 10*3/uL (ref 150–400)
RBC: 3.82 MIL/uL — ABNORMAL LOW (ref 3.87–5.11)
RDW: 15.3 % (ref 11.5–15.5)
WBC: 8.3 10*3/uL (ref 4.0–10.5)
nRBC: 0 % (ref 0.0–0.2)

## 2019-01-30 LAB — SODIUM, URINE, RANDOM: Sodium, Ur: 141 mmol/L

## 2019-01-30 LAB — BASIC METABOLIC PANEL
Anion gap: 9 (ref 5–15)
BUN: 53 mg/dL — ABNORMAL HIGH (ref 8–23)
CO2: 16 mmol/L — ABNORMAL LOW (ref 22–32)
Calcium: 8 mg/dL — ABNORMAL LOW (ref 8.9–10.3)
Chloride: 122 mmol/L — ABNORMAL HIGH (ref 98–111)
Creatinine, Ser: 1.33 mg/dL — ABNORMAL HIGH (ref 0.44–1.00)
GFR calc Af Amer: 39 mL/min — ABNORMAL LOW (ref >60)
GFR calc non Af Amer: 34 mL/min — ABNORMAL LOW (ref >60)
Glucose, Bld: 105 mg/dL — ABNORMAL HIGH (ref 70–99)
Potassium: 4.3 mmol/L (ref 3.5–5.1)
Sodium: 147 mmol/L — ABNORMAL HIGH (ref 135–145)

## 2019-01-30 LAB — CREATININE, URINE, RANDOM: Creatinine, Urine: <10 mg/dL

## 2019-01-30 LAB — GLUCOSE, CAPILLARY
Glucose-Capillary: 100 mg/dL — ABNORMAL HIGH (ref 70–99)
Glucose-Capillary: 132 mg/dL — ABNORMAL HIGH (ref 70–99)
Glucose-Capillary: 162 mg/dL — ABNORMAL HIGH (ref 70–99)
Glucose-Capillary: 171 mg/dL — ABNORMAL HIGH (ref 70–99)

## 2019-01-30 LAB — OCCULT BLOOD X 1 CARD TO LAB, STOOL: Fecal Occult Bld: POSITIVE — AB

## 2019-01-30 MED ORDER — DEXTROSE-NACL 5-0.45 % IV SOLN
INTRAVENOUS | Status: DC
  Administered 2019-01-30 – 2019-01-31 (×3): via INTRAVENOUS

## 2019-01-30 NOTE — Progress Notes (Signed)
Patient somnolent this morning - will not awaken enough to eat or take PO meds.  Grimaces and pulls away when doing mouth care

## 2019-01-30 NOTE — Progress Notes (Addendum)
[facility]  PROGRESS NOTE  Lauren Rose CMK:[id number] DOB: 07-17-1922 DOA: 01/28/2019 PCP: Derinda Late, MD   Brief HPI:   85 year old female with a history of hypertension, type 2 diabetes mellitus, ischemic CVA, acquired hypothyroidism who was admitted to hospital with acute kidney injury.  Patient initially presented with vulvar bleeding.  She has a history of multiple bleeding, has a pessary in place which is replaced by GYN every 3 to 4 months.     Subjective   Patient seen and examined, lethargic, barely opens eyes to sternal rub.   Assessment/Plan:     1. Acute kidney injury-likely from poor p.o. intake, patient's baseline creatinine was 0.92 in March 2020.  Patient presented with creatinine of 2.26.Patient also was on lisinopril and etodolac.  Urine sodium and creatinine are currently pending.  Creatinine is improving, today creatinine is 1.33.  Changed IV fluids to D5 half-normal saline to increase free water intake as patient has developed hyperchloremic metabolic acidosis.  2. Hyperchloremic metabolic acidosis-chloride is 122, bicarb 16.  Will change IV fluids to D5 half-normal saline at 100 mL/h.Patient sodium is also rising to 147.  3. Lethargy/hypersomnolence-likely due to developing hypernatremia, poor p.o. intake.  Started on D5 half-normal saline as above.  Will also obtain CT head to rule out underlying intracranial pathology.  Will hold Paxil.  4. Vaginal/vulvar bleeding-patient had speculum examination done by ED physician yesterday which did not show any active bleeding in the vagina.  She did have superficial varicosities in the vulva which is likely source of bleeding.  Discussed with daughter, she would like pessary to be replaced in the hospital.  Patient gets vaginal pessary replaced every 3 months.  Tried to call patient's GYN Dr. Ronita Hipps  as per daughter request, left message on his phone.  5. Protein calorie malnutrition/poor p.o. intake-patient  recently had stroke and has had a poor p.o. intake since then.  Nutrition consulted.  Will obtain speech therapy evaluation.    6. Bradycardia-patient's heart rate was in 48-54 in the ED.  Atenolol was held yesterday, heart rate has improved to 60s.  We will continue to monitor.  Blood pressure is stable.  7. Hypothyroidism-continue Synthroid.  8. Hypertension-atenolol is on hold.  HCTZ, lisinopril are on hold due to worsening renal function.  Atenolol is also on hold as above.  Will restart Norvasc.  9. Diabetes mellitus type 2-continue sliding scale insulin with NovoLog.  Januvia on hold.  10. Elevated liver enzymes-patient has mild elevation of alk phos 223, AST and ALT.  Will obtain abdominal ultrasound.  Will hold simvastatin.     CBG: Recent Labs  Lab 01/29/19 0738 01/29/19 1224 01/29/19 2340 01/30/19 0813 01/30/19 1254  GLUCAP 126* 153* 104* 100* 132*    CBC: Recent Labs  Lab 01/28/19 1643 01/29/19 0423 01/30/19 0440  WBC 6.9 7.6 8.3  NEUTROABS 3.7  --   --   HGB 12.8 11.2* 10.8*  HCT 42.6 37.7 35.9*  MCV 92.2 92.4 94.0  PLT 437* 380 569    Basic Metabolic Panel: Recent Labs  Lab 01/28/19 1643 01/29/19 0423 01/30/19 0440  NA 144 145 147*  K 4.8 5.1 4.3  CL 111 117* 122*  CO2 21* 18* 16*  GLUCOSE 163* 143* 105*  BUN 84* 69* 53*  CREATININE 2.26* 1.57* 1.33*  CALCIUM 8.7* 8.3* 8.0*  MG 2.8* 2.6*  --      DVT prophylaxis: SCDs  Code Status: DNR  Family Communication: No family at bedside  Disposition  Plan: likely home when medically ready for discharge     Consultants:    Procedures:     Antibiotics:   Anti-infectives (From admission, onward)   None       Objective   Vitals:   01/29/19 2213 01/30/19 0347 01/30/19 0633 01/30/19 1257  BP: (!) 133/42 (!) 149/52  (!) 152/48  Pulse: 65 70  78  Resp: 18 18  14   Temp: 98 F (36.7 C) 99.3 F (37.4 C)  99 F (37.2 C)  TempSrc:  Oral  Oral  SpO2: 94% 94%  95%  Weight:   60.6  kg   Height:        Intake/Output Summary (Last 24 hours) at 01/30/2019 1452 Last data filed at 01/30/2019 1300 Gross per 24 hour  Intake 2315.87 ml  Output 1 ml  Net 2314.87 ml   Filed Weights   01/28/19 1537 01/30/19 0633  Weight: 58.9 kg 60.6 kg     Physical Examination:   General: Lethargic, barely opens eyes to sternal rub.  Cardiovascular: S1-S2, regular, no murmur auscultated  Respiratory: Clear to auscultation bilaterally  Abdomen: Abdomen is soft, nontender, no organomegaly  Extremities: No edema in the lower extremities  Neurologic: Somnolent, barely opens eyes to sternal rub.      Data Reviewed: I have personally reviewed following labs and imaging studies   No results found for this or any previous visit (from the past 240 hour(s)).   Liver Function Tests: Recent Labs  Lab 01/29/19 0423  AST 80*  ALT 120*  ALKPHOS 223*  BILITOT 0.4  PROT 6.5  ALBUMIN 2.8*   No results for input(s): LIPASE, AMYLASE in the last 168 hours. No results for input(s): AMMONIA in the last 168 hours.  Cardiac Enzymes: Recent Labs  Lab 01/28/19 1643  CKTOTAL 153   BNP (last 3 results) No results for input(s): BNP in the last 8760 hours.  ProBNP (last 3 results) No results for input(s): PROBNP in the last 8760 hours.    Studies: No results found.  Scheduled Meds: . amLODipine  10 mg Oral Daily  . aspirin  325 mg Oral Q breakfast  . calcium-vitamin D  1 tablet Oral Q breakfast  . docusate sodium  100 mg Oral Daily  . ferrous sulfate  325 mg Oral Q breakfast  . insulin aspart  0-9 Units Subcutaneous TID WC  . levothyroxine  125 mcg Oral QAC breakfast  . multivitamin with minerals  1 tablet Oral Daily  . pantoprazole  40 mg Oral Daily  . PARoxetine  10 mg Oral q morning - 10a    Admission status: Inpatient: Based on patients clinical presentation and evaluation of above clinical data, I have made determination that patient meets Inpatient criteria at  this time.  Time spent: 20 min  [facility] Pager [phone]. If 7PM-7AM, please contact night-coverage at [email or web address], Office  [phone]  password [email or web address]  01/30/2019, 2:52 PM  LOS: 1 day

## 2019-01-30 NOTE — Progress Notes (Signed)
SLP Cancellation Note  Patient Details Name: Lauren Rose MRN: [id number] DOB: 23-Dec-1921   Cancelled treatment:       Reason Eval/Treat Not Completed: Other (comment);Fatigue/lethargy limiting ability to participate   Macario Golds 01/30/2019, 3:18 PM  Luanna Salk, [facility] SLP Acute Rehab Services Pager [phone] Office [phone]

## 2019-01-30 NOTE — Progress Notes (Signed)
While obtaining ABD [place] radiology staff alerted me that it appeared her bladder was very full.  Went in to assess - bladder is distended upon palpation and bladder scanner is showing greater than 750 cc.  Patient has been consistently "wet" through out the day, though .. bed linens have been changed several times through out this shift and usually soaked through layers.  This has been thought to be due to mix of urine and vaginal discharge ... now uncertain what fluids on linens are.  Vaginal discharge has appeared to be mucous-like with bloody streaks and foul smelling.  Patient has also had 2 mushy, dark tarry stools.  MD made aware of distended bladder findings - order for foley cath placed.   Upon inserting catheter, cloudy pink urine with sediment draining slowly.  Radiology staff did point out suspected "sludge" in bladder as well.  Patient now sent for head CT.  Will continue to monitor.

## 2019-01-31 ENCOUNTER — Inpatient Hospital Stay (HOSPITAL_COMMUNITY): Payer: Medicare Other

## 2019-01-31 ENCOUNTER — Encounter: Payer: Self-pay | Admitting: *Deleted

## 2019-01-31 DIAGNOSIS — Z7189 Other specified counseling: Secondary | ICD-10-CM

## 2019-01-31 DIAGNOSIS — Z515 Encounter for palliative care: Secondary | ICD-10-CM

## 2019-01-31 DIAGNOSIS — R0902 Hypoxemia: Secondary | ICD-10-CM

## 2019-01-31 LAB — BLOOD GAS, ARTERIAL
Acid-base deficit: 4.6 mmol/L — ABNORMAL HIGH (ref 0.0–2.0)
Bicarbonate: 17.9 mmol/L — ABNORMAL LOW (ref 20.0–28.0)
Drawn by: 331471
FIO2: 21
O2 Saturation: 94.6 %
Patient temperature: 37
pCO2 arterial: 26.6 mmHg — ABNORMAL LOW (ref 32.0–48.0)
pH, Arterial: 7.442 (ref 7.350–7.450)
pO2, Arterial: 75.2 mmHg — ABNORMAL LOW (ref 83.0–108.0)

## 2019-01-31 LAB — GLUCOSE, CAPILLARY
Glucose-Capillary: 166 mg/dL — ABNORMAL HIGH (ref 70–99)
Glucose-Capillary: 115 mg/dL — ABNORMAL HIGH (ref 70–99)
Glucose-Capillary: 109 mg/dL — ABNORMAL HIGH (ref 70–99)
Glucose-Capillary: 110 mg/dL — ABNORMAL HIGH (ref 70–99)

## 2019-01-31 LAB — COMPREHENSIVE METABOLIC PANEL
ALT: 69 U/L — ABNORMAL HIGH (ref 0–44)
AST: 31 U/L (ref 15–41)
Albumin: 2.2 g/dL — ABNORMAL LOW (ref 3.5–5.0)
Alkaline Phosphatase: 143 U/L — ABNORMAL HIGH (ref 38–126)
Anion gap: 7 (ref 5–15)
BUN: 43 mg/dL — ABNORMAL HIGH (ref 8–23)
CO2: 17 mmol/L — ABNORMAL LOW (ref 22–32)
Calcium: 8 mg/dL — ABNORMAL LOW (ref 8.9–10.3)
Chloride: 122 mmol/L — ABNORMAL HIGH (ref 98–111)
Creatinine, Ser: 1.12 mg/dL — ABNORMAL HIGH (ref 0.44–1.00)
GFR calc Af Amer: 48 mL/min — ABNORMAL LOW (ref >60)
GFR calc non Af Amer: 42 mL/min — ABNORMAL LOW (ref >60)
Glucose, Bld: 183 mg/dL — ABNORMAL HIGH (ref 70–99)
Potassium: 3.8 mmol/L (ref 3.5–5.1)
Sodium: 146 mmol/L — ABNORMAL HIGH (ref 135–145)
Total Bilirubin: 0.3 mg/dL (ref 0.3–1.2)
Total Protein: 5.9 g/dL — ABNORMAL LOW (ref 6.5–8.1)

## 2019-01-31 LAB — CBC
HCT: 36.7 % (ref 36.0–46.0)
Hemoglobin: 10.9 g/dL — ABNORMAL LOW (ref 12.0–15.0)
MCH: 27.7 pg (ref 26.0–34.0)
MCHC: 29.7 g/dL — ABNORMAL LOW (ref 30.0–36.0)
MCV: 93.1 fL (ref 80.0–100.0)
Platelets: 359 10*3/uL (ref 150–400)
RBC: 3.94 MIL/uL (ref 3.87–5.11)
RDW: 15.2 % (ref 11.5–15.5)
WBC: 10.4 10*3/uL (ref 4.0–10.5)
nRBC: 0 % (ref 0.0–0.2)

## 2019-01-31 LAB — AMMONIA: Ammonia: 65 umol/L — ABNORMAL HIGH (ref 9–35)

## 2019-01-31 LAB — OCCULT BLOOD X 1 CARD TO LAB, STOOL: Fecal Occult Bld: POSITIVE — AB

## 2019-01-31 MED ORDER — PANTOPRAZOLE SODIUM 40 MG PO TBEC: 40.0000 mg | DELAYED_RELEASE_TABLET | Freq: Two times a day (BID) | ORAL | Status: DC

## 2019-01-31 MED ORDER — PANTOPRAZOLE SODIUM 40 MG IV SOLR
40.0000 mg | Freq: Two times a day (BID) | INTRAVENOUS | Status: DC
  Administered 2019-01-31 – 2019-02-01 (×2): 40 mg via INTRAVENOUS
  Filled 2019-01-31 (×2): qty 40

## 2019-01-31 MED ORDER — HYDRALAZINE HCL 20 MG/ML IJ SOLN
5.0000 mg | Freq: Once | INTRAMUSCULAR | Status: AC
  Administered 2019-01-31: 5 mg via INTRAVENOUS
  Filled 2019-01-31: qty 1

## 2019-01-31 MED ORDER — SODIUM CHLORIDE 0.9 % IV BOLUS
500.0000 mL | Freq: Once | INTRAVENOUS | Status: AC
  Administered 2019-01-31: 500 mL via INTRAVENOUS

## 2019-01-31 MED ORDER — LACTULOSE 10 GM/15ML PO SOLN: 15.0000 g | Freq: Three times a day (TID) | ORAL | Status: DC

## 2019-01-31 MED ORDER — LACTULOSE ENEMA
300.0000 mL | Freq: Once | ORAL | Status: AC
  Administered 2019-01-31: 300 mL via RECTAL
  Filled 2019-01-31: qty 300

## 2019-01-31 MED ORDER — SODIUM CHLORIDE 0.9 % IV SOLN
1.0000 g | INTRAVENOUS | Status: DC
  Administered 2019-01-31 – 2019-02-01 (×2): 1 g via INTRAVENOUS
  Filled 2019-01-31 (×2): qty 1

## 2019-01-31 NOTE — Progress Notes (Addendum)
NP on call, Baltazar Najjar, notified regarding pt's increased BP and change to a yellow MEWs. One time dose of hydralazine ordered and administered. MEWs protocol started. Pt has been lethargic but arouses during q2 turns. Will continue to monitor.

## 2019-01-31 NOTE — Progress Notes (Addendum)
Pt sleepy/lethargic this am difficult to arouse, holding breakfast and meds this am until pt wakes and able to take oral liquids. IV infusing at D5.45 @100cc /hr. SRP, RN

## 2019-01-31 NOTE — Progress Notes (Signed)
PROGRESS NOTE    Lauren Rose  WEX:[id number] DOB: Oct 14, 1921 DOA: 01/28/2019 PCP: Derinda Late, MD   Brief Narrative: Patient is a 85 year old female with history of hypertension, diabetes type 2, CVA, hypothyroidism who was admitted from [facility] facility for the evaluation of vaginal bleeding.  She was also found to have acute kidney injury on presentation. Current problem is altered mental status, possible GI bleed.  Assessment & Plan:   Principal Problem:   AKI (acute kidney injury) ([place]) Active Problems:   Essential hypertension   Diabetes (HCC)   Hypothyroidism   Bradycardia   GERD (gastroesophageal reflux disease)  Altered mental status: Remains altered.  Very drowsy and almost unarousable this morning.  History of recent CVA.  Will do MRI of the brain.  CT head did not show any acute intracranial abnormalities.  Continue to monitor mental status.  Currently she is on dysphagia 2 diet. Will check ABG, ammonia level today.  Acute kidney injury: Significantly improved with IV fluids.  Baseline kidney function normal.  Ultrasound of the abdomen showed markedly distended urinary bladder which appears to contain echogenic debris.  We will taper IV fluids.  Hyponatremia: Continue gentle D5 half-normal saline.  Vaginal/valvular bleeding: Initial reason for admission from nursing home.  No active bleeding as per examination in the emergency department.  She has superficial varicosities in the vulva which is likely source of bleeding.  She gets vaginal pessary replaced every 3 months by GYN.  UA shows hematuria.Follow up with GYN as an outpatient.  Possible GI bleed: Got report that she had black tarry stool last night.  Hemoglobin dropped to 10.9.  Currently hemodynamically stable.  FOBT positive.  Discussed with on-call GI, Dr. Cristina Gong.  We planned to continue monitoring with PPI.  Aspirin  Stopped.Not a candidate for endoscopic procedure at  present.  Recent Ischemic stroke: She was discharged from [facility] on 01/07/[facility]21 skilled nursing facility after management of acute ischemic stroke.  She was found to have acute multiple vascular territory stroke.  She was on full dose aspirin 325 mg daily and statin.  Hemoglobin A1c of 6.2.  LDL of 70.  She was planned for 30-day event monitoring as an outpatient.  Type 2 diabetes mellitus: Hemoglobin A1c of 6.2 as per recent lab report.  On oral hypoglycemics at home.  Hypothyroidism: Continue Synthyroid.  Hypertension: Currently hemodynamically stable.  ACE  which she was taking at skilled nursing facility is on hold.  Deconditioning/debility: We will request for physical therapy evaluation.  Nutrition Problem: Inadequate oral intake Etiology: poor appetite      DVT prophylaxis: SCD Code Status: DNR Family Communication: Discussed with daughter on length Disposition Plan: Plan for discharge to skilled nursing facility after improvement in the mental status.   Consultants: None  Procedures: None  Antimicrobials:  Anti-infectives (From admission, onward)   Start     Dose/Rate Route Frequency Ordered Stop   01/31/19 1000  cefTRIAXone (ROCEPHIN) 1 g in sodium chloride 0.9 % 100 mL IVPB     1 g 200 mL/hr over 30 Minutes Intravenous Every 24 hours 01/31/19 0926        Subjective: Patient seen and examined the bedside this morning.  Hemodynamically stable.  Remains sleepy, drowsy.  Hardly arousable.  Relevant information could not be obtained from patient.  Objective: Vitals:   01/31/19 0240 01/31/19 0500 01/31/19 0557 01/31/19 0600  BP: (!) 139/54   (!) 143/58  Pulse: 81   76  Resp: Marland Kitchen)  28  (!) 24   Temp: 99.2 F (37.3 C)   98.9 F (37.2 C)  TempSrc: Axillary   Oral  SpO2: 95%   91%  Weight:  59.2 kg    Height:        Intake/Output Summary (Last 24 hours) at 01/31/2019 1000 Last data filed at 01/31/2019 5631 Gross per 24 hour  Intake 1775.89 ml  Output 1225  ml  Net 550.89 ml   Filed Weights   01/28/19 1537 01/30/19 0633 01/31/19 0500  Weight: 58.9 kg 60.6 kg 59.2 kg    Examination:  General exam: Not in distress, elderly debilitated female HEENT:PERRL,Oral mucosa moist, Ear/Nose normal on gross exam Respiratory system: Bilateral decreased air entry on the bases Cardiovascular system: S1 & S2 heard, RRR. No JVD, murmurs, rubs, gallops or clicks. No pedal edema. Gastrointestinal system: Abdomen is nondistended, soft and nontender. No organomegaly or masses felt. Normal bowel sounds heard. Central nervous system: Not alert or oriented.   Extremities: No edema, no clubbing ,no cyanosis, distal peripheral pulses palpable. Skin: No rashes, lesions or ulcers,no icterus ,no pallor  Data Reviewed: I have personally reviewed following labs and imaging studies  CBC: Recent Labs  Lab 01/28/19 1643 01/29/19 0423 01/30/19 0440 01/31/19 0444  WBC 6.9 7.6 8.3 10.4  NEUTROABS 3.7  --   --   --   HGB 12.8 11.2* 10.8* 10.9*  HCT 42.6 37.7 35.9* 36.7  MCV 92.2 92.4 94.0 93.1  PLT 437* 380 366 497   Basic Metabolic Panel: Recent Labs  Lab 01/28/19 1643 01/29/19 0423 01/30/19 0440 01/31/19 0444  NA 144 145 147* 146*  K 4.8 5.1 4.3 3.8  CL 111 117* 122* 122*  CO2 21* 18* 16* 17*  GLUCOSE 163* 143* 105* 183*  BUN 84* 69* 53* 43*  CREATININE 2.26* 1.57* 1.33* 1.12*  CALCIUM 8.7* 8.3* 8.0* 8.0*  MG 2.8* 2.6*  --   --    GFR: Estimated Creatinine Clearance: 25.9 mL/min (A) (by C-G formula based on SCr of 1.12 mg/dL (H)). Liver Function Tests: Recent Labs  Lab 01/29/19 0423 01/31/19 0444  AST 80* 31  ALT 120* 69*  ALKPHOS 223* 143*  BILITOT 0.4 0.3  PROT 6.5 5.9*  ALBUMIN 2.8* 2.2*   No results for input(s): LIPASE, AMYLASE in the last 168 hours. No results for input(s): AMMONIA in the last 168 hours. Coagulation Profile: No results for input(s): INR, PROTIME in the last 168 hours. Cardiac Enzymes: Recent Labs  Lab  01/28/19 1643  CKTOTAL 153   BNP (last 3 results) No results for input(s): PROBNP in the last 8760 hours. HbA1C: No results for input(s): HGBA1C in the last 72 hours. CBG: Recent Labs  Lab 01/30/19 0813 01/30/19 1254 01/30/19 1655 01/30/19 2159 01/31/19 0736  GLUCAP 100* 132* 162* 171* 166*   Lipid Profile: No results for input(s): CHOL, HDL, LDLCALC, TRIG, CHOLHDL, LDLDIRECT in the last 72 hours. Thyroid Function Tests: No results for input(s): TSH, T4TOTAL, FREET4, T3FREE, THYROIDAB in the last 72 hours. Anemia Panel: No results for input(s): VITAMINB12, FOLATE, FERRITIN, TIBC, IRON, RETICCTPCT in the last 72 hours. Sepsis Labs: No results for input(s): PROCALCITON, LATICACIDVEN in the last 168 hours.  No results found for this or any previous visit (from the past 240 hour(s)).       Radiology Studies: Ct Head Wo Contrast  Result Date: 01/30/2019 CLINICAL DATA:  Altered mental status EXAM: CT HEAD WITHOUT CONTRAST TECHNIQUE: Contiguous axial images were obtained from the base  of the skull through the vertex without intravenous contrast. COMPARISON:  CTA neck 01/05/2019 FINDINGS: Brain: There is no mass, hemorrhage or extra-axial collection. The size and configuration of the ventricles and extra-axial CSF spaces are normal. There is hypoattenuation of the white matter, most commonly indicating chronic small vessel disease. Vascular: No abnormal hyperdensity of the major intracranial arteries or dural venous sinuses. No intracranial atherosclerosis. Skull: The visualized skull base, calvarium and extracranial soft tissues are normal. Sinuses/Orbits: No fluid levels or advanced mucosal thickening of the visualized paranasal sinuses. No mastoid or middle ear effusion. The orbits are normal. IMPRESSION: 1. No acute intracranial abnormality. 2. Findings of chronic small vessel disease. Electronically Signed   By: Ulyses Jarred M.D.   On: 01/30/2019 18:57   US Abdomen  Complete  Result Date: 01/30/2019 CLINICAL DATA:  85 year old female with history of elevated liver function tests. EXAM: ABDOMEN ULTRASOUND COMPLETE COMPARISON:  Abdominal ultrasound 10/08/2013. FINDINGS: Gallbladder: Multiple echogenic foci with some posterior acoustic shadowing, compatible with small gallstones. Gallbladder does not appear distended. Gallbladder wall thickness is normal at 2 mm. No sonographic Murphy's sign on examination. No pericholecystic fluid. Common bile duct: Diameter: 4.5 mm in diameter in the porta hepatis. Liver: Heterogeneous in echotexture, but generally hyperechoic, suggesting a background of hepatic steatosis. In the left lateral aspect of the liver there is an area of more normal echotexture which appears hypoechoic relative to the remainder of the otherwise hyperechoic hepatic parenchyma, which demonstrates some internal blood flow, as geographic margins. This is estimated to measure approximately 3.5 x 2.2 x 2.8 cm, favored to reflect a focal area of fatty sparing. Portal vein is patent on color Doppler imaging with normal direction of blood flow towards the liver. IVC: No abnormality visualized. Pancreas: Visualized portion unremarkable. Spleen: Size and appearance within normal limits. Right Kidney: Length: 11 cm. Echogenicity within normal limits. In the inferior aspect of the right kidney there is an irregular-shaped lesion which measures approximately 5 x 4 x 2.7 cm which is internally anechoic with increased through transmission, favored to represent a partially involuted simple cyst (remote CT the abdomen and pelvis 09/25/2013 demonstrated a very large cyst in this region on the prior examination). No hydronephrosis visualized. Left Kidney: Length: 10.1 cm. Echogenicity within normal limits. There are 3 anechoic lesions with increased through transmission in the left kidney, largest of which measures 5.1 x 4.8 x 5.2 cm in the interpolar region, compatible with simple  cysts. No hydronephrosis visualized. Abdominal aorta: No aneurysm visualized. Other findings: Urinary bladder appears massively distended, visualized well above the level of the umbilicus, with internal echogenic debris. Urinary bladder estimated to measure approximately 15.7 x 7.4 x 9.2 cm (estimated volume of 562 mL). IMPRESSION: 1. Unusual appearance of the liver. This is poorly evaluated on today's ultrasound examination, but favored to reflect a background of hepatic steatosis with regional fatty sparing in the left lobe of the liver. The possibility of an underlying mass in this left lobe of the liver is not entirely excluded, however. Further evaluation with nonemergent hepatic protocol CT scan of the abdomen and pelvis is suggested if clinically appropriate. Abdominal MRI with and without IV gadolinium would be a preferable examination, however, given the patient's advanced age, it is unlikely that she would be adequately able to follow breathing instructions and hold her breath for the examination to ensure diagnostic image quality. 2. Markedly distended urinary bladder which appears to contain echogenic debris. Correlation with urinalysis is recommended. 3. Cholelithiasis without findings  to suggest an acute cholecystitis at this time. 4. Anechoic lesions in both kidneys, most compatible with simple cysts (including a large involuting cyst in the right kidney), as above. Electronically Signed   By: Vinnie Langton M.D.   On: 01/30/2019 20:05        Scheduled Meds:  amLODipine  10 mg Oral Daily   calcium-vitamin D  1 tablet Oral Q breakfast   docusate sodium  100 mg Oral Daily   ferrous sulfate  325 mg Oral Q breakfast   insulin aspart  0-9 Units Subcutaneous TID WC   levothyroxine  125 mcg Oral QAC breakfast   multivitamin with minerals  1 tablet Oral Daily   pantoprazole  40 mg Oral BID   Continuous Infusions:  cefTRIAXone (ROCEPHIN)  IV     dextrose 5 % and 0.45% NaCl 100  mL/hr at 01/31/19 0835     LOS: 2 days    Time spent:35  mins. More than 50% of that time was spent in counseling and/or coordination of care.      Shelly Coss, MD [facility] Pager [phone]  If 7PM-7AM, please contact night-coverage [email or web address] Password [email or web address] 01/31/2019, 10:00 AM

## 2019-01-31 NOTE — Plan of Care (Signed)
  Problem: Clinical Measurements: Goal: Ability to maintain clinical measurements within normal limits will improve Outcome: Progressing Goal: Will remain free from infection Outcome: Progressing Goal: Diagnostic test results will improve Outcome: Progressing Goal: Respiratory complications will improve Outcome: Progressing Goal: Cardiovascular complication will be avoided Outcome: Progressing   Problem: Pain Managment: Goal: General experience of comfort will improve Outcome: Progressing   Problem: Safety: Goal: Ability to remain free from injury will improve Outcome: Progressing   Problem: Skin Integrity: Goal: Risk for impaired skin integrity will decrease Outcome: Progressing   

## 2019-01-31 NOTE — Progress Notes (Signed)
PT Cancellation Note  Patient Details Name: Lauren Rose MRN: [id number] DOB: July 15, 1922   Cancelled Treatment:    Reason Eval/Treat Not Completed: Medical issues which prohibited therapy Pt currently lethargic and to be taken down for MRI at present time.   Gertha Lichtenberg,KATHrine E 01/31/2019, 2:03 PM Carmelia Bake, PT, DPT Acute Rehabilitation Services Office: [phone] Pager: [phone]

## 2019-01-31 NOTE — Progress Notes (Signed)
Patient ID: Lauren Rose, female   DOB: [birth year], 85 y.o.   MRN: [id number] 01/30/19 [employer] contacted, patient requested cancellation of [employer] cardiac event monitor service. Patient enrolled 4/8 to have monitor shipped, however never baselined.  [employer] will begin the monitor recovery process.

## 2019-01-31 NOTE — Progress Notes (Signed)
Pt lethargic and unresponsive to touch throughout the day. Pt BP 83/68 MD updated and 300cc NS of a 500cc bolus given pt BP increased to 151/60 and 162/68, MD updated and IVF stopped, irregular breathing noted. Pt O2 sat 97% on 2 L. Lactulose enema given as ordered unable to hold enema, dark tarry stools noted. Moderate amount of bleeding noted from vaginal area and blood tinged urine noted with clots in foley.MD updated of status. Will continue to monitor. SRP, RN

## 2019-01-31 NOTE — TOC Initial Note (Signed)
Transition of Care [facility]) - Initial/Assessment Note    Patient Details  Name: Lauren Rose MRN: [id number] Date of Birth: [birth year]  Transition of Care [facility]) CM/SW Contact:    Wende Neighbors, LCSW Phone Number: 01/31/2019, 11:09 AM  Clinical Narrative:  Patient is from [facility] and was at facility receiving rehab. CSW spoke with Shirlee Limerick ann admission coordinator for facility and she stated that family placed a bed hold for patient and facility will be able to take patient back once medically stable for dc. CSW will continue to follow for medical readiness              Expected Discharge Plan: [place] Barriers to Discharge: Continued Medical Work up   Patient Goals and CMS Choice   CMS Medicare.gov Compare Post Acute Care list provided to:: (from facility) Choice offered to / list presented to : (from facility)  Expected Discharge Plan and Services Expected Discharge Plan: [place] In-house Referral: Clinical Social Work     Living arrangements for the past 2 months: [place]                          Prior Living Arrangements/Services Living arrangements for the past 2 months: [place] Lives with:: Facility Resident                   Activities of Daily Living Home Assistive Devices/Equipment: Environmental consultant (specify type), Wheelchair, Radio producer (specify quad or straight) ADL Screening (condition at time of admission) Patient's cognitive ability adequate to safely complete daily activities?: No Is the patient deaf or have difficulty hearing?: No Does the patient have difficulty seeing, even when wearing glasses/contacts?: Yes Does the patient have difficulty concentrating, remembering, or making decisions?: Yes Patient able to express need for assistance with ADLs?: Yes Does the patient have difficulty dressing or bathing?: Yes Independently performs ADLs?: Yes (appropriate for developmental age) Does the  patient have difficulty walking or climbing stairs?: Yes Weakness of Legs: Both Weakness of Arms/Hands: None  Permission Sought/Granted                  Emotional Assessment       Orientation: : Oriented to Self      Admission diagnosis:  Acute kidney injury [facility]) [N17.9] Patient Active Problem List   Diagnosis Date Noted  . AKI (acute kidney injury) ([place]) 01/28/2019  . Bradycardia 01/28/2019  . GERD (gastroesophageal reflux disease) 01/28/2019  . Acute ischemic stroke ([place]) 01/05/2019  . Rhabdomyolysis 01/04/2019  . Essential hypertension 01/04/2019  . Diabetes ([place]) 01/04/2019  . Hypothyroidism 01/04/2019  . Emesis 01/04/2019  . Unwitnessed fall 01/04/2019  . Degenerative arthritis of hip, left 07/20/2013   PCP:  Derinda Late, MD Pharmacy:   [id number] - [place], [address] [place] [address] Phone: [phone] Fax: [phone]     Social Determinants of Health (SDOH) Interventions    Readmission Risk Interventions Readmission Risk Prevention Plan 01/08/2019  Post Dischage Appt Complete  Medication Screening Complete  Transportation Screening Complete  Some recent data might be hidden

## 2019-01-31 NOTE — Evaluation (Signed)
SLP Cancellation Note  Patient Details Name: Lauren Rose MRN: [id number] DOB: 26-Aug-1922   Cancelled treatment:       Reason Eval/Treat Not Completed: Medical issues which prohibited therapy(rn reports pt lethargic and is down for MRI and CXR at this time, will continue efforts)   Macario Golds 01/31/2019, 3:17 PM

## 2019-01-31 NOTE — Consult Note (Signed)
Palliative care consult  Reason for consult: Goals of care in light of continued lethargy  Palliative medicine consult received.  Chart reviewed including personal review of pertinent labs and imaging.  Discussed with bedside RN.  Briefly, Lauren Rose is a 85 year old female with PMHx HTN, DM, CVA, hypothyroidism, admitted from [facility] with concern for vulvar bleeding.  Her clinical course has been complicated by decreased mental status and severe lethargy.  Also noted to be hypotensive today.  Discussion had with daughter by Dr. Tawanna Solo and referral placed for residential hospice.  I saw and examined Lauren Rose today.  She does not respond to verbal or tactile stimulation.  I called and was able to reach her daughter, Lauren Rose.  Lauren Rose reports that she understands that her mother is severely ill.  She says that her family and her independence are things most important to her mother.  She is confused about changes in her mother's mental status and reports wishing she knew underlying etiology.  Discussed antibiotics, lactulose, and prior plan for MRI.  Lauren Rose says that she was told MRI could not be done as she is wearing event monitor.  States, "who cares about the results of that monitor if it means she can't get an MRI that may show something that is much more likely to kill her now than whatever the monitor shows.  Why wouldn't you take it off and do the MRI?"  We discussed continued decline and interventions that would be likely to have impact on reversible causes of lethargy (abx, lactulose).  Also discussed that I did not think that results of MRI would change management and it would be reasonable to forgo if we are going to base decision based on her clinical course and likely outcomes of continued lethargy and not maintaining nutrition and hydration.  She is clear her mother would not want artificial nutrition and hydration.    - Discussed with daughter, Lauren Rose.  She is agreeable to  residential hospice but reports "not wanting to miss anything if she has something that could get better."  We discussed that she has gotten lactulose and on antibiotics and has been treated for likely treatable etiologies.  I offered to check in on her tomorrow (after 24 hours on antibiotics) and call to update her prior to transfer to residential hospice.  Total time: 60 minutes Greater than 50%  of this time was spent counseling and coordinating care related to the above assessment and plan.  Micheline Rough, MD [place] Team [phone]

## 2019-01-31 NOTE — Progress Notes (Signed)
Patient become hypotensive this afternoon.  Had lengthy discussion with the daughter.  I updated patient's situation and prognosis.  She is very understandable and is interested on residential hospice for her mother. Patient is a DNR.  We will not escalate her treatment.  I have requested case manager for arranging residential hospice.  At the meantime,I have  also consulted palliative care.

## 2019-02-01 LAB — GLUCOSE, CAPILLARY
Glucose-Capillary: 104 mg/dL — ABNORMAL HIGH (ref 70–99)
Glucose-Capillary: 101 mg/dL — ABNORMAL HIGH (ref 70–99)

## 2019-02-01 LAB — URINE CULTURE: Culture: >=100000 — AB

## 2019-02-01 NOTE — Progress Notes (Signed)
[facility]: [employer]   Discussed with pt's daughter to confirm interest in [place] at [facility]. Discussed goals she is in agreement with comfort care approach. My medical director has approved her to come to the facility and bed was offered. Family in agreement and accepted offer. Webb Silversmith RN [phone]

## 2019-02-01 NOTE — Progress Notes (Signed)
PT Cancellation Note / Screen  Patient Details Name: Lauren Rose MRN: [id number] DOB: [birth year]   Cancelled Treatment:    Reason Eval/Treat Not Completed: PT screened, no needs identified, will sign off Pt remains lethargic and plans for residential hospice currently in place.  Please re-order if new needs arise however PT to sign off at this time.   [name],KATHrine E 02/01/2019, [name], PT, DPT Acute Rehabilitation Services Office: [phone] Pager: [phone]

## 2019-02-01 NOTE — Progress Notes (Signed)
Daily Progress Note   Patient Name: ANACAREN KOHAN       Date: 02/01/2019 DOB: May 07, 1922  Age: 85 y.o. MRN#: [id number] Attending Physician: Shelly Coss, MD Primary Care Physician: Derinda Late, MD Admit Date: 01/28/2019  Reason for Consultation/Follow-up: Establishing goals of care  Subjective: I saw and examined Ms. Werts this AM.  Remains largely non-responsive.  Opens eyes partially to noxious stimuli.  Called and discussed with her daughter, Tye [place].  We talked about lack of significant improvement, and she is in agreement that residential hospice in best next step.  Length of Stay: 3  Current Medications: Scheduled Meds:  . amLODipine  10 mg Oral Daily  . calcium-vitamin D  1 tablet Oral Q breakfast  . docusate sodium  100 mg Oral Daily  . ferrous sulfate  325 mg Oral Q breakfast  . insulin aspart  0-9 Units Subcutaneous TID WC  . lactulose  15 g Oral TID  . levothyroxine  125 mcg Oral QAC breakfast  . multivitamin with minerals  1 tablet Oral Daily  . pantoprazole (PROTONIX) IV  40 mg Intravenous Q12H    Continuous Infusions: . cefTRIAXone (ROCEPHIN)  IV 1 g (02/01/19 0922)    PRN Meds: acetaminophen **OR** acetaminophen  Physical Exam         General: Lethargic, in no acute distress.  Heart: Regular rate and rhythm. No murmur appreciated. Lungs: Decreased air movement, clear Abdomen: Soft, nontender, nondistended, positive bowel sounds.  Ext: No significant edema Skin: Warm and dry  Vital Signs: BP (!) 127/108 (BP Location: Right Arm)   Pulse 76   Temp 99.3 F (37.4 C) (Axillary)   Resp 16   Ht 5\' 4"  (1.626 m)   Wt 57.6 kg   SpO2 99%   BMI 21.80 kg/m  SpO2: SpO2: 99 % O2 Device: O2 Device: Nasal Cannula O2 Flow Rate: O2 Flow Rate (L/min): 2 L/min   Intake/output summary:   Intake/Output Summary (Last 24 hours) at 02/01/2019 1109 Last data filed at 02/01/2019 0424 Gross per 24 hour  Intake 0 ml  Output 681 ml  Net -681 ml   LBM: Last BM Date: 02/01/19 Baseline Weight: Weight: 58.9 kg Most recent weight: Weight: 57.6 kg       Palliative Assessment/Data:    Flowsheet Rows     Most  Recent Value  Intake Tab  Referral Department  Hospitalist  Unit at Time of Referral  Med/Surg Unit  Date Notified  01/31/19  Palliative Care Type  New Palliative care  Reason for referral  Clarify Goals of Care  Date of Admission  01/28/19  Date first seen by Palliative Care  01/31/19  # of days Palliative referral response time  0 Day(s)  # of days IP prior to Palliative referral  3  Clinical Assessment  Psychosocial & Spiritual Assessment  Palliative Care Outcomes      Patient Active Problem List   Diagnosis Date Noted  . AKI (acute kidney injury) ([place]) 01/28/2019  . Bradycardia 01/28/2019  . GERD (gastroesophageal reflux disease) 01/28/2019  . Acute ischemic stroke ([place]) 01/05/2019  . Rhabdomyolysis 01/04/2019  . Essential hypertension 01/04/2019  . Diabetes ([place]) 01/04/2019  . Hypothyroidism 01/04/2019  . Emesis 01/04/2019  . Unwitnessed fall 01/04/2019  . Degenerative arthritis of hip, left 07/20/2013    Palliative Care Assessment & Plan   Patient Profile: 85 year old female admitted with vulvar bleeding and with hospital course complicated by continued decline and lethargy.  Not eating, drinking, or interactive.  Recommendations/Plan:  Plan for residential hospice for EOL care.  Daughter [facility] good [facility] and would like to pursue placement there for EOL care.  Goals of Care and Additional Recommendations:  Limitations on Scope of Treatment: Avoid Hospitalization and Full Comfort Care  Code Status:    Code Status Orders  (From admission, onward)         Start     Ordered    01/28/19 1858  Do not attempt resuscitation (DNR)  Continuous    Question Answer Comment  In the event of cardiac or respiratory ARREST Do not call a "code blue"   In the event of cardiac or respiratory ARREST Do not perform Intubation, CPR, defibrillation or ACLS   In the event of cardiac or respiratory ARREST Use medication by any route, position, wound care, and other measures to relive pain and suffering. May use oxygen, suction and manual treatment of airway obstruction as needed for comfort.      01/28/19 1858        Code Status History    Date Active Date Inactive Code Status Order ID Comments User Context   01/04/2019 2226 01/08/2019 1824 DNR [id number]  Mercy Riding, MD ED   07/20/2013 1730 07/24/2013 1916 Full Code [id number]  Mcarthur Rossetti, MD Inpatient       Prognosis:   < 2 weeks  Discharge Planning:  Hospice facility  Care plan was discussed with daughter, Dr. Tawanna Solo, liaison [facility]  Thank you for allowing the Palliative Medicine Team to assist in the care of this patient.   Time In: 0830 Time Out: 0900 Total Time 30 Prolonged Time Billed No      Greater than 50%  of this time was spent counseling and coordinating care related to the above assessment and plan.  Micheline Rough, MD  Please contact Palliative Medicine Team phone at [phone] for questions and concerns.

## 2019-02-01 NOTE — Care Management Important Message (Signed)
Important Message  Patient Details  Name: Lauren Rose MRN: [id number] Date of Birth: [birth year]   Medicare Important Message Given:  Yes    [name] 02/01/2019, 10:27 AMImportant Message  Patient Details  Name: Lauren Rose MRN: [id number] Date of Birth: Jun 19, 1922   Medicare Important Message Given:  Yes    [name] 02/01/2019, 10:27 AM

## 2019-02-01 NOTE — Progress Notes (Signed)
Gave report to [place] at the [facility].  She asked that I leave the iv intact.  She was wondering about deactivating the external pacemaker before pt left but Md said not to do anything.  Pt will be [place] [facility]

## 2019-02-01 NOTE — Discharge Summary (Signed)
Physician Discharge Summary  SHANDEL BUSIC ERD:[id number] DOB: 1922-05-03 DOA: 01/28/2019  PCP: Derinda Late, MD  Admit date: 01/28/2019 Discharge date: 02/01/2019  Admitted From: Home Disposition:  Hospice  Discharge Condition:Guarded CODE STATUS:DNR Diet recommendation:   Brief/Interim Summary: Patient is a 85 year old female with history of hypertension, diabetes type 2, CVA, hypothyroidism who was admitted from [facility] skilled nursing facility for the evaluation of vaginal bleeding.  She was also found to have acute kidney injury on presentation. Hospital course was remarkable for persistent altered mental status, poor oral intake.  She was also having persistent hematuria from unknown etiology.  Since her mental status did not improve, we discussed with the family and given her multiple comorbidities, advanced age we decided on pursuing hospice services.  Palliative care was also following.  She is  being discharged to hospice house today.  Following problems were addressed during hospitalization:  Altered mental status: Remains altered.  Very drowsy and almost unarousable this morning.  History of recent CVA.    CT head did not show any new acute intracranial abnormalities.    Acute kidney injury: Significantly improved with IV fluids.  Baseline kidney function normal.  Ultrasound of the abdomen showed markedly distended urinary bladder which appears to contain echogenic debris.   Vaginal/valvular bleeding: Initial reason for admission from nursing home.  No active bleeding as per examination in the emergency department.  She has superficial varicosities in the vulva which is likely source of bleeding.  She gets vaginal pessary replaced every 3 months by GYN.  UA shows hematuria.  Possible GI bleed:   Hemoglobin dropped to 10.9.   FOBT positive.  Discussed with on-call GI, Dr. Cristina Gong.  Aspirin  Stopped.Not a candidate for endoscopic procedure at present.  Recent Ischemic  stroke: She was discharged from [facility] on 01/07/[facility] skilled nursing facility after management of acute ischemic stroke.  She was found to have acute multiple vascular territory stroke.  She was on full dose aspirin 325 mg daily and statin.  Hemoglobin A1c of 6.2.  LDL of 70.    Type 2 diabetes mellitus: Hemoglobin A1c of 6.2 as per recent lab report.  On oral hypoglycemics at home.  Hypothyroidism: On Synthyroid.  Hypertension:BP stable.   Discharge Diagnoses:  Principal Problem:   AKI (acute kidney injury) ([place]) Active Problems:   Essential hypertension   Diabetes ([place])   Hypothyroidism   Bradycardia   GERD (gastroesophageal reflux disease)    Discharge Instructions   Allergies as of 02/01/2019   No Known Allergies     Medication List    STOP taking these medications   ALPRAZolam 0.25 MG tablet Commonly known as:  XANAX   amLODipine 10 MG tablet Commonly known as:  NORVASC   aspirin 325 MG EC tablet   atenolol 25 MG tablet Commonly known as:  TENORMIN   calcium-vitamin D 500-200 MG-UNIT tablet Commonly known as:  OSCAL WITH D   docusate sodium 100 MG capsule Commonly known as:  COLACE   estradiol 0.1 MG/GM vaginal cream Commonly known as:  ESTRACE   etodolac 200 MG capsule Commonly known as:  LODINE   ferrous sulfate 325 (65 FE) MG tablet   hydrochlorothiazide 25 MG tablet Commonly known as:  HYDRODIURIL   ipratropium-albuterol 0.5-2.5 (3) MG/3ML Soln Commonly known as:  DUONEB   levothyroxine 125 MCG tablet Commonly known as:  SYNTHROID   lisinopril 10 MG tablet Commonly known as:  ZESTRIL   losartan 100 MG tablet Commonly known  as:  COZAAR   multivitamin with minerals Tabs tablet   omeprazole 20 MG capsule Commonly known as:  PRILOSEC   PARoxetine 10 MG tablet Commonly known as:  PAXIL   polyethylene glycol 17 g packet Commonly known as:  MIRALAX / GLYCOLAX   Resource ThickenUp Clear Powd   simvastatin 10 MG  tablet Commonly known as:  ZOCOR   sitaGLIPtin 50 MG tablet Commonly known as:  JANUVIA   traMADol 50 MG tablet Commonly known as:  ULTRAM       No Known Allergies  Consultations:  Palliative care   Procedures/Studies: Ct Angio Head W Or Wo Contrast  Result Date: 01/05/2019 CLINICAL DATA:  Cerebral hemorrhage suspected.  Stroke.  Fall. EXAM: CT ANGIOGRAPHY HEAD AND NECK TECHNIQUE: Multidetector CT imaging of the head and neck was performed using the standard protocol during bolus administration of intravenous contrast. Multiplanar CT image reconstructions and MIPs were obtained to evaluate the vascular anatomy. Carotid stenosis measurements (when applicable) are obtained utilizing NASCET criteria, using the distal internal carotid diameter as the denominator. CONTRAST:  40mL OMNIPAQUE IOHEXOL 350 MG/ML SOLN COMPARISON:  CT head 01/04/2019, MRI 01/05/2019 FINDINGS: CTA NECK FINDINGS Aortic arch: Mild atherosclerotic calcification aortic arch without aneurysm or dissection. Proximal great vessels patent with mild atherosclerotic disease. Right carotid system: Mild atherosclerotic disease right carotid bifurcation without significant stenosis Left carotid system: Mild atherosclerotic disease left carotid bifurcation without significant stenosis. Vertebral arteries: Both vertebral arteries patent to the basilar without significant stenosis. Skeleton: Mild cervical spondylosis.  No acute skeletal abnormality. Other neck: Negative for mass or adenopathy. Upper chest: Lung apices clear bilaterally. Review of the MIP images confirms the above findings CTA HEAD FINDINGS Anterior circulation: Atherosclerotic calcification cavernous carotid bilaterally without significant stenosis. Anterior and middle cerebral arteries patent bilaterally without significant stenosis or occlusion. Posterior circulation: Both vertebral arteries patent to the basilar without stenosis. PICA patent bilaterally. Basilar widely  patent. Right AICA patent. Superior cerebellar and posterior cerebral arteries patent bilaterally without significant stenosis. Fetal origin left posterior cerebral artery. Venous sinuses: Patent Anatomic variants: None Delayed phase: Normal enhancement postcontrast administration. Small 5 mm hyperdensity left parietal periventricular white matter unchanged consistent with a small area of acute hemorrhage. Hypodensity central medulla consistent with acute infarct. New area of hypodensity left medial cerebellum may represent artifact versus interval infarction. Chronic ischemic changes in the white matter and pons. Review of the MIP images confirms the above findings IMPRESSION: 1. Mild atherosclerotic disease carotid bifurcation bilaterally. No significant carotid artery or vertebral artery stenosis in the neck. 2. Negative for intracranial large vessel occlusion 3. Hypodensity central medulla compatible with small acute infarct as noted on MRI. Hypodensity left medial mid cerebellum likely artifact on CT. No acute infarct in this area on recent MRI. Electronically Signed   By: Franchot Gallo M.D.   On: 01/05/2019 10:42   Ct Head Wo Contrast  Result Date: 01/30/2019 CLINICAL DATA:  Altered mental status EXAM: CT HEAD WITHOUT CONTRAST TECHNIQUE: Contiguous axial images were obtained from the base of the skull through the vertex without intravenous contrast. COMPARISON:  CTA neck 01/05/2019 FINDINGS: Brain: There is no mass, hemorrhage or extra-axial collection. The size and configuration of the ventricles and extra-axial CSF spaces are normal. There is hypoattenuation of the white matter, most commonly indicating chronic small vessel disease. Vascular: No abnormal hyperdensity of the major intracranial arteries or dural venous sinuses. No intracranial atherosclerosis. Skull: The visualized skull base, calvarium and extracranial soft tissues are normal. Sinuses/Orbits:  No fluid levels or advanced mucosal  thickening of the visualized paranasal sinuses. No mastoid or middle ear effusion. The orbits are normal. IMPRESSION: 1. No acute intracranial abnormality. 2. Findings of chronic small vessel disease. Electronically Signed   By: Ulyses Jarred M.D.   On: 01/30/2019 18:57   Ct Head Wo Contrast  Result Date: 01/04/2019 CLINICAL DATA:  Recent fall with pain, initial encounter EXAM: CT HEAD WITHOUT CONTRAST CT CERVICAL SPINE WITHOUT CONTRAST TECHNIQUE: Multidetector CT imaging of the head and cervical spine was performed following the standard protocol without intravenous contrast. Multiplanar CT image reconstructions of the cervical spine were also generated. COMPARISON:  None. FINDINGS: CT HEAD FINDINGS Brain: Chronic atrophic and ischemic changes are identified. No findings to suggest acute hemorrhage, acute infarction or space-occupying mass lesion are noted. Vascular: No hyperdense vessel or unexpected calcification. Skull: Normal. Negative for fracture or focal lesion. Sinuses/Orbits: No acute finding. Other: None. CT CERVICAL SPINE FINDINGS Alignment: Within normal limits. Skull base and vertebrae: 7 cervical segments are well visualized. Vertebral body height is well maintained. Multilevel facet hypertrophic changes are seen. Mild osteophytic changes are noted as well. No acute fracture or acute facet abnormality is seen. Soft tissues and spinal canal: Surrounding soft tissue structures are within normal limits. Upper chest: Within normal limits. Other: None IMPRESSION: CT of the head: Chronic atrophic and ischemic changes without acute abnormality. CT of cervical spine: Multilevel degenerative change without acute abnormality. Electronically Signed   By: Inez Catalina M.D.   On: 01/04/2019 18:53   Ct Angio Neck W Or Wo Contrast  Result Date: 01/05/2019 CLINICAL DATA:  Cerebral hemorrhage suspected.  Stroke.  Fall. EXAM: CT ANGIOGRAPHY HEAD AND NECK TECHNIQUE: Multidetector CT imaging of the head and neck  was performed using the standard protocol during bolus administration of intravenous contrast. Multiplanar CT image reconstructions and MIPs were obtained to evaluate the vascular anatomy. Carotid stenosis measurements (when applicable) are obtained utilizing NASCET criteria, using the distal internal carotid diameter as the denominator. CONTRAST:  23mL OMNIPAQUE IOHEXOL 350 MG/ML SOLN COMPARISON:  CT head 01/04/2019, MRI 01/05/2019 FINDINGS: CTA NECK FINDINGS Aortic arch: Mild atherosclerotic calcification aortic arch without aneurysm or dissection. Proximal great vessels patent with mild atherosclerotic disease. Right carotid system: Mild atherosclerotic disease right carotid bifurcation without significant stenosis Left carotid system: Mild atherosclerotic disease left carotid bifurcation without significant stenosis. Vertebral arteries: Both vertebral arteries patent to the basilar without significant stenosis. Skeleton: Mild cervical spondylosis.  No acute skeletal abnormality. Other neck: Negative for mass or adenopathy. Upper chest: Lung apices clear bilaterally. Review of the MIP images confirms the above findings CTA HEAD FINDINGS Anterior circulation: Atherosclerotic calcification cavernous carotid bilaterally without significant stenosis. Anterior and middle cerebral arteries patent bilaterally without significant stenosis or occlusion. Posterior circulation: Both vertebral arteries patent to the basilar without stenosis. PICA patent bilaterally. Basilar widely patent. Right AICA patent. Superior cerebellar and posterior cerebral arteries patent bilaterally without significant stenosis. Fetal origin left posterior cerebral artery. Venous sinuses: Patent Anatomic variants: None Delayed phase: Normal enhancement postcontrast administration. Small 5 mm hyperdensity left parietal periventricular white matter unchanged consistent with a small area of acute hemorrhage. Hypodensity central medulla consistent  with acute infarct. New area of hypodensity left medial cerebellum may represent artifact versus interval infarction. Chronic ischemic changes in the white matter and pons. Review of the MIP images confirms the above findings IMPRESSION: 1. Mild atherosclerotic disease carotid bifurcation bilaterally. No significant carotid artery or vertebral artery stenosis in the neck.  2. Negative for intracranial large vessel occlusion 3. Hypodensity central medulla compatible with small acute infarct as noted on MRI. Hypodensity left medial mid cerebellum likely artifact on CT. No acute infarct in this area on recent MRI. Electronically Signed   By: Franchot Gallo M.D.   On: 01/05/2019 10:42   Ct Cervical Spine Wo Contrast  Result Date: 01/04/2019 CLINICAL DATA:  Recent fall with pain, initial encounter EXAM: CT HEAD WITHOUT CONTRAST CT CERVICAL SPINE WITHOUT CONTRAST TECHNIQUE: Multidetector CT imaging of the head and cervical spine was performed following the standard protocol without intravenous contrast. Multiplanar CT image reconstructions of the cervical spine were also generated. COMPARISON:  None. FINDINGS: CT HEAD FINDINGS Brain: Chronic atrophic and ischemic changes are identified. No findings to suggest acute hemorrhage, acute infarction or space-occupying mass lesion are noted. Vascular: No hyperdense vessel or unexpected calcification. Skull: Normal. Negative for fracture or focal lesion. Sinuses/Orbits: No acute finding. Other: None. CT CERVICAL SPINE FINDINGS Alignment: Within normal limits. Skull base and vertebrae: 7 cervical segments are well visualized. Vertebral body height is well maintained. Multilevel facet hypertrophic changes are seen. Mild osteophytic changes are noted as well. No acute fracture or acute facet abnormality is seen. Soft tissues and spinal canal: Surrounding soft tissue structures are within normal limits. Upper chest: Within normal limits. Other: None IMPRESSION: CT of the head:  Chronic atrophic and ischemic changes without acute abnormality. CT of cervical spine: Multilevel degenerative change without acute abnormality. Electronically Signed   By: Inez Catalina M.D.   On: 01/04/2019 18:53   Mr Brain Wo Contrast  Addendum Date: 01/05/2019   ADDENDUM REPORT: 01/05/2019 04:42 ADDENDUM: Findings were communicated by telephone to the covering nurse practitioner [place] at 3:43 a.m. on 01/05/2019. Electronically Signed   By: Jeannine Boga M.D.   On: 01/05/2019 04:42   Result Date: 01/05/2019 CLINICAL DATA:  Initial evaluation for acute TIA, dysarthria. EXAM: MRI HEAD WITHOUT CONTRAST TECHNIQUE: Multiplanar, multiecho pulse sequences of the brain and surrounding structures were obtained without intravenous contrast. COMPARISON:  Prior CT from earlier the same day. FINDINGS: Brain: Diffuse prominence of the CSF containing spaces compatible with generalized age-related cerebral atrophy. Changes are most prominent at the anterior temporal lobes bilaterally. Patchy and confluent T2/FLAIR hyperintensity within the periventricular and deep white matter both cerebral hemispheres most consistent with chronic microvascular ischemic disease. Chronic microvascular ischemic changes present within the pons as well. 6 mm focus of diffusion abnormality seen within the posterior left corona radiata, consistent with a small acute a infarct (series 5, image 59). Associated susceptibility artifact, consistent with hemorrhage. Small focus of hemorrhage seen within this region on prior CT as well. Layering susceptibility artifact within the occipital horns of the lateral ventricles, left greater than right, suggestive of intraventricular extension, age indeterminate, but could be acute to subacute in nature. No hydrocephalus or ventricular trapping. Additional 5 mm focus of diffusion abnormality involving the subcortical white matter of the high anterior left frontal lobe consistent with an acute to  subacute ischemic infarct (series 5, image 67). Additionally, there is a 6 mm focus of linear diffusion abnormality involving the central aspect of the medulla (series 5, image 43). Associated signal loss on ADC map (series 6, image 6). Finding consistent with a small acute to early subacute ischemic infarct. No other evidence for acute or subacute ischemia. Gray-white matter differentiation otherwise maintained. No other areas of remote cortical infarction. No mass lesion, midline shift or mass effect. Trace extra-axial collection measuring 2-3 mm  in thickness seen overlying the left occipital convexity, likely a small subdural hematoma, age indeterminate, but could be subacute in nature. This is not well visualized on prior CT. Vascular: Major intracranial vascular flow voids are maintained. Skull and upper cervical spine: Craniocervical junction within normal limits. Upper cervical spine normal. Bone marrow signal intensity within normal limits. Hyperostosis frontalis interna noted. No scalp soft tissue abnormality. Sinuses/Orbits: Globes and orbital soft tissues demonstrate no acute finding. Patient status post bilateral ocular lens replacement. Paranasal sinuses are clear. No mastoid effusion. Inner ear structures grossly normal. Other: None. IMPRESSION: 1. 6 mm acute hemorrhagic infarct involving the periventricular white matter of the posterior left corona radiata without significant mass effect. Associated trace layering intraventricular hemorrhage somewhat age indeterminate, but could be related to intraventricular extension. No hydrocephalus or ventricular trapping. 2. Additional 6 mm linear acute to early subacute ischemic nonhemorrhagic infarct involving the central medulla. 3. Additional 6 mm acute to early subacute ischemic nonhemorrhagic infarct involving the subcortical white matter of the anterior left frontal lobe. 4. Trace 2-3 mm subdural collection overlying the occipital convexity, likely blood  products. Finding is age indeterminate, but could be acute to subacute in nature. No associated mass effect. 5. Underlying age-related cerebral atrophy with moderate chronic microvascular ischemic disease. A call is in to the ordering clinician. Findings will be communicated as soon as possible. Electronically Signed: By: Jeannine Boga M.D. On: 01/05/2019 03:33   US Abdomen Complete  Result Date: 01/30/2019 CLINICAL DATA:  85 year old female with history of elevated liver function tests. EXAM: ABDOMEN ULTRASOUND COMPLETE COMPARISON:  Abdominal ultrasound 10/08/2013. FINDINGS: Gallbladder: Multiple echogenic foci with some posterior acoustic shadowing, compatible with small gallstones. Gallbladder does not appear distended. Gallbladder wall thickness is normal at 2 mm. No sonographic Murphy's sign on examination. No pericholecystic fluid. Common bile duct: Diameter: 4.5 mm in diameter in the porta hepatis. Liver: Heterogeneous in echotexture, but generally hyperechoic, suggesting a background of hepatic steatosis. In the left lateral aspect of the liver there is an area of more normal echotexture which appears hypoechoic relative to the remainder of the otherwise hyperechoic hepatic parenchyma, which demonstrates some internal blood flow, as geographic margins. This is estimated to measure approximately 3.5 x 2.2 x 2.8 cm, favored to reflect a focal area of fatty sparing. Portal vein is patent on color Doppler imaging with normal direction of blood flow towards the liver. IVC: No abnormality visualized. Pancreas: Visualized portion unremarkable. Spleen: Size and appearance within normal limits. Right Kidney: Length: 11 cm. Echogenicity within normal limits. In the inferior aspect of the right kidney there is an irregular-shaped lesion which measures approximately 5 x 4 x 2.7 cm which is internally anechoic with increased through transmission, favored to represent a partially involuted simple cyst (remote  CT the abdomen and pelvis 09/25/2013 demonstrated a very large cyst in this region on the prior examination). No hydronephrosis visualized. Left Kidney: Length: 10.1 cm. Echogenicity within normal limits. There are 3 anechoic lesions with increased through transmission in the left kidney, largest of which measures 5.1 x 4.8 x 5.2 cm in the interpolar region, compatible with simple cysts. No hydronephrosis visualized. Abdominal aorta: No aneurysm visualized. Other findings: Urinary bladder appears massively distended, visualized well above the level of the umbilicus, with internal echogenic debris. Urinary bladder estimated to measure approximately 15.7 x 7.4 x 9.2 cm (estimated volume of 562 mL). IMPRESSION: 1. Unusual appearance of the liver. This is poorly evaluated on today's ultrasound examination, but favored to reflect  a background of hepatic steatosis with regional fatty sparing in the left lobe of the liver. The possibility of an underlying mass in this left lobe of the liver is not entirely excluded, however. Further evaluation with nonemergent hepatic protocol CT scan of the abdomen and pelvis is suggested if clinically appropriate. Abdominal MRI with and without IV gadolinium would be a preferable examination, however, given the patient's advanced age, it is unlikely that she would be adequately able to follow breathing instructions and hold her breath for the examination to ensure diagnostic image quality. 2. Markedly distended urinary bladder which appears to contain echogenic debris. Correlation with urinalysis is recommended. 3. Cholelithiasis without findings to suggest an acute cholecystitis at this time. 4. Anechoic lesions in both kidneys, most compatible with simple cysts (including a large involuting cyst in the right kidney), as above. Electronically Signed   By: Vinnie Langton M.D.   On: 01/30/2019 20:05   Dg Pelvis Portable  Result Date: 01/04/2019 CLINICAL DATA:  Recent fall with  pelvic pain, initial encounter EXAM: PORTABLE PELVIS 1-2 VIEWS COMPARISON:  None. FINDINGS: Left hip replacement is noted. The pelvic ring is intact. Degenerative changes of the lumbar spine are noted. No acute fracture is seen. Postsurgical changes are noted. IMPRESSION: No acute abnormality noted. Electronically Signed   By: Inez Catalina M.D.   On: 01/04/2019 18:29   Dg Chest Port 1 View  Result Date: 01/31/2019 CLINICAL DATA:  Hypoxia. EXAM: PORTABLE CHEST 1 VIEW COMPARISON:  Radiograph of January 04, 2019. FINDINGS: The heart size and mediastinal contours are within normal limits. Both lungs are clear. Atherosclerosis of thoracic aorta is noted. No pneumothorax or pleural effusion is noted. The visualized skeletal structures are unremarkable. IMPRESSION: No acute cardiopulmonary abnormality seen. Aortic Atherosclerosis (ICD10-I70.0). Electronically Signed   By: Marijo Conception M.D.   On: 01/31/2019 15:34   Dg Chest Portable 1 View  Result Date: 01/04/2019 CLINICAL DATA:  Recent fall with chest pain, initial encounter EXAM: PORTABLE CHEST 1 VIEW COMPARISON:  01/06/18 FINDINGS: Cardiac shadow is within normal limits and stable. Mild aortic calcifications are again seen. The lungs are well aerated bilaterally. No acute bony abnormality is noted. IMPRESSION: No active disease. Electronically Signed   By: Inez Catalina M.D.   On: 01/04/2019 18:27   Dg Shoulder Left  Result Date: 01/04/2019 CLINICAL DATA:  Recent fall with left shoulder pain, initial encounter EXAM: LEFT SHOULDER - 2+ VIEW COMPARISON:  None. FINDINGS: Degenerative changes of the acromioclavicular joint are noted. No dislocation or fracture is seen. The underlying rib cage is within normal limits. IMPRESSION: No acute abnormality noted. Electronically Signed   By: Inez Catalina M.D.   On: 01/04/2019 18:28   Dg Swallowing Func-speech Pathology  Result Date: 01/06/2019 Objective Swallowing Evaluation: Type of Study: MBS-Modified Barium Swallow  Study  Patient Details Name: ANNELISE MCCOY MRN: [id number] Date of Birth: [birth year] Today's Date: 01/06/2019 Time: SLP Start Time (ACUTE ONLY): 1300 -SLP Stop Time (ACUTE ONLY): 1325 SLP Time Calculation (min) (ACUTE ONLY): 25 min Past Medical History: Past Medical History: Diagnosis Date . Anemia  . Arthritis  . Cancer (HCC)   hx of thyroid cancer . Colon polyps  . Depression  . Diabetes mellitus without complication ([place])  . Frequency  . GERD (gastroesophageal reflux disease)  . History of gout  . History of skin cancer  . Hyperlipidemia  . Hypertension  . Hypothyroidism  . Kidney cysts   followed by Dr. Diona Fanti . 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 Jearld Fenton every 6 months x 2 yrs . Nocturia  . PONV (postoperative nausea and vomiting)  . Rash   yeast infection under breasts Past Surgical History: Past Surgical History: Procedure Laterality Date . COLON SURGERY  2004  colon polyps then infection srg x3 . HERNIA REPAIR  2005 . THYROIDECTOMY  2006 . TOTAL HIP ARTHROPLASTY Left 07/20/2013  Procedure: LEFT TOTAL HIP ARTHROPLASTY ANTERIOR APPROACH;  Surgeon: Mcarthur Rossetti, MD;  Location: [facility];  Service: Orthopedics;  Laterality: Left; HPI: Patient is a 85  y.o. female who was independent from home with history of anemia, hypertension, type 2 diabetes, arthritis, hypothyroidism, depression who was admitted last night after she was found in the bathroom by her daughter.  Patient said that she went to bathroom and lost her balance and fell on the floor and could not get up.  Patient states that the right leg gave up. Admitted from the emergency room with rhabdomyolysis, fall and leukocytosis.  Initial skeletal survey negative.  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  Subjective: pleasant, sitting in chair in radiology suite Assessment / Plan / Recommendation CHL IP CLINICAL IMPRESSIONS 01/06/2019 Clinical Impression Patient presents with a mild oral and a mod-severe pharyngeal  dysphagia with signifcantly reduced sensation leading to swallow initiation delays to level of vallecula with puree and regular solids and honey and nectar liquids, and swallow initiation delays to level of pyriform sinus with thin liquids. Patient exhibited mild oral transit delays, but was able to masticate hard solid and transit barium pill/tablet without significant difficulty. Patient aspirated before the swallow with thin liquids and during the swallow with nectar thick liquids and although she did exhibit a cough response, it was not effective to clear aspirate. Patient did not exhibit any penetration or aspiration of honey thick liquids(on [facility] imaging slides, at the end of study, SLP mislabeled a nectar liquid trial with 'H' for honey thick liquids). Patient did exhibit vallecular and pyriform sinus residuals with all tested consistencies (minimal for thin liquids, mild-moderate for honey thick, puree, regular solids) but did not exhibit any aspiration after the swallow with these residuals, though cannot r/o risk from this type of event occuring. Chin tuck was not effective to prevent penetration or aspiration with thin liquids and inconsistently effective with nectar thick liquids.  SLP Visit Diagnosis Dysphagia, oropharyngeal phase (R13.12) Attention and concentration deficit following -- Frontal lobe and executive function deficit following -- Impact on safety and function Severe aspiration risk   CHL IP TREATMENT RECOMMENDATION 01/06/2019 Treatment Recommendations Therapy as outlined in treatment plan below   Prognosis 01/06/2019 Prognosis for Safe Diet Advancement Fair Barriers to Reach Goals Severity of deficits Barriers/Prognosis Comment -- CHL IP DIET RECOMMENDATION 01/06/2019 SLP Diet Recommendations Regular solids;Honey thick liquids Liquid Administration via Cup Medication Administration Whole meds with puree Compensations Slow rate;Small sips/bites Postural Changes Seated upright at 90 degrees    CHL IP OTHER RECOMMENDATIONS 01/06/2019 Recommended Consults -- Oral Care Recommendations Oral care BID Other Recommendations Prohibited food (jello, ice cream, thin soups);Order thickener from pharmacy;Clarify dietary restrictions;Remove water pitcher   CHL IP FOLLOW UP RECOMMENDATIONS 01/06/2019 Follow up Recommendations Home health SLP   CHL IP FREQUENCY AND DURATION 01/06/2019 Speech Therapy Frequency (ACUTE ONLY) min 2x/week Treatment Duration 2 weeks      CHL IP ORAL PHASE 01/06/2019 Oral Phase Impaired Oral - Pudding Teaspoon -- Oral - Pudding Cup -- Oral - Honey Teaspoon -- Oral - Honey Cup --  Oral - Nectar Teaspoon -- Oral - Nectar Cup -- Oral - Nectar Straw -- Oral - Thin Teaspoon Premature spillage Oral - Thin Cup Premature spillage Oral - Thin Straw -- Oral - Puree Weak lingual manipulation;Delayed oral transit Oral - Mech Soft -- Oral - Regular Delayed oral transit;Weak lingual manipulation Oral - Multi-Consistency -- Oral - Pill Delayed oral transit Oral Phase - Comment --  CHL IP PHARYNGEAL PHASE 01/06/2019 Pharyngeal Phase Impaired Pharyngeal- Pudding Teaspoon -- Pharyngeal -- Pharyngeal- Pudding Cup -- Pharyngeal -- Pharyngeal- Honey Teaspoon -- Pharyngeal -- Pharyngeal- Honey Cup -- Pharyngeal -- Pharyngeal- Nectar Teaspoon -- Pharyngeal -- Pharyngeal- Nectar Cup Delayed swallow initiation-vallecula;Delayed swallow initiation-pyriform sinuses;Reduced airway/laryngeal closure;Penetration/Aspiration during swallow;Trace aspiration;Pharyngeal residue - valleculae;Pharyngeal residue - pyriform Pharyngeal Material enters airway, passes BELOW cords and not ejected out despite cough attempt by patient Pharyngeal- Nectar Straw -- Pharyngeal -- Pharyngeal- Thin Teaspoon Delayed swallow initiation-pyriform sinuses;Reduced airway/laryngeal closure;Penetration/Aspiration before swallow;Penetration/Aspiration during swallow;Pharyngeal residue - valleculae;Pharyngeal residue - pyriform;Trace aspiration Pharyngeal  Material enters airway, passes BELOW cords and not ejected out despite cough attempt by patient Pharyngeal- Thin Cup Delayed swallow initiation-pyriform sinuses;Moderate aspiration;Pharyngeal residue - pyriform;Penetration/Aspiration before swallow Pharyngeal Material enters airway, passes BELOW cords and not ejected out despite cough attempt by patient Pharyngeal- Thin Straw -- Pharyngeal -- Pharyngeal- Puree Delayed swallow initiation-vallecula;Pharyngeal residue - valleculae;Pharyngeal residue - pyriform Pharyngeal -- Pharyngeal- Mechanical Soft -- Pharyngeal -- Pharyngeal- Regular Delayed swallow initiation-vallecula;Pharyngeal residue - valleculae;Pharyngeal residue - pyriform Pharyngeal -- Pharyngeal- Multi-consistency -- Pharyngeal -- Pharyngeal- Pill Delayed swallow initiation-vallecula Pharyngeal -- Pharyngeal Comment --  CHL IP CERVICAL ESOPHAGEAL PHASE 01/06/2019 Cervical Esophageal Phase WFL Pudding Teaspoon -- Pudding Cup -- Honey Teaspoon -- Honey Cup -- Nectar Teaspoon -- Nectar Cup -- Nectar Straw -- Thin Teaspoon -- Thin Cup -- Thin Straw -- Puree -- Mechanical Soft -- Regular -- Multi-consistency -- Pill -- Cervical Esophageal Comment -- Sonia Baller, MA, CCC-SLP Speech Therapy [facility]              Vas [place] Lower Extremity Venous (dvt)  Result Date: 01/07/2019  Lower Venous Study Indications: Embolic stroke.  Performing Technologist: Antonieta Pert RDMS, RVT  Examination Guidelines: A complete evaluation includes B-mode imaging, spectral Doppler, color Doppler, and power Doppler as needed of all accessible portions of each vessel. Bilateral testing is considered an integral part of a complete examination. Limited examinations for reoccurring indications may be performed as noted.  Right Venous Findings: +---------+---------------+---------+-----------+----------+-------+          CompressibilityPhasicitySpontaneityPropertiesSummary  +---------+---------------+---------+-----------+----------+-------+ CFV      Full           Yes      Yes                          +---------+---------------+---------+-----------+----------+-------+ SFJ      Full                                                 +---------+---------------+---------+-----------+----------+-------+ FV Prox  Full                                                 +---------+---------------+---------+-----------+----------+-------+ FV Mid   Full                                                 +---------+---------------+---------+-----------+----------+-------+  FV DistalFull                                                 +---------+---------------+---------+-----------+----------+-------+ PFV      Full                                                 +---------+---------------+---------+-----------+----------+-------+ POP      Full           Yes      Yes                          +---------+---------------+---------+-----------+----------+-------+ PTV      Full                                                 +---------+---------------+---------+-----------+----------+-------+ PERO     Full                                                 +---------+---------------+---------+-----------+----------+-------+ GSV      Full                                                 +---------+---------------+---------+-----------+----------+-------+  Left Venous Findings: +---------+---------------+---------+-----------+----------+-------+          CompressibilityPhasicitySpontaneityPropertiesSummary +---------+---------------+---------+-----------+----------+-------+ CFV      Full           Yes      Yes                          +---------+---------------+---------+-----------+----------+-------+ SFJ      Full                                                  +---------+---------------+---------+-----------+----------+-------+ FV Prox  Full                                                 +---------+---------------+---------+-----------+----------+-------+ FV Mid   Full                                                 +---------+---------------+---------+-----------+----------+-------+ FV DistalFull                                                 +---------+---------------+---------+-----------+----------+-------+  PFV      Full                                                 +---------+---------------+---------+-----------+----------+-------+ POP      Full           Yes      Yes                          +---------+---------------+---------+-----------+----------+-------+ PTV      Full                                                 +---------+---------------+---------+-----------+----------+-------+ PERO     Full                                                 +---------+---------------+---------+-----------+----------+-------+ GSV      Full                                                 +---------+---------------+---------+-----------+----------+-------+    Summary: Right: There is no evidence of deep vein thrombosis in the lower extremity. No cystic structure found in the popliteal fossa. Atherosclerotic changes seen throughout extremity. Left: There is no evidence of deep vein thrombosis in the lower extremity. No cystic structure found in the popliteal fossa. Atherosclerotic changes seen throughout extremity.  *See table(s) above for measurements and observations. Electronically signed by Deitra [place] MD on 01/07/2019 at 6:44:58 AM.    Final       Subjective: Patient seen and examined the bedside this morning.  Opens eyes only on calling her name.  Very lethargic Discharge Exam: Vitals:   01/31/19 2118 02/01/19 0423  BP: (!) 158/66 (!) 127/108  Pulse: 94 76  Resp: 15 16  Temp: 98.6 F (37 C)  99.3 F (37.4 C)  SpO2: 97% 99%   Vitals:   01/31/19 1600 01/31/19 2118 02/01/19 0423 02/01/19 0500  BP: (!) 162/68 (!) 158/66 (!) 127/108   Pulse: 86 94 76   Resp:  15 16   Temp:  98.6 F (37 C) 99.3 F (37.4 C)   TempSrc:  Oral Axillary   SpO2:  97% 99%   Weight:    57.6 kg  Height:        General: Pt is not alert, awake Cardiovascular: RRR, S1/S2 +, no rubs, no gallops Respiratory: CTA bilaterally, no wheezing, no rhonchi Abdominal: Soft, NT, ND, bowel sounds + Extremities: no edema, no cyanosis    The results of significant diagnostics from this hospitalization (including imaging, microbiology, ancillary and laboratory) are listed below for reference.     Microbiology: Recent Results (from the past 240 hour(s))  Urine culture     Status: Abnormal   Collection Time: 01/30/19  6:05 PM  Result Value Ref Range Status   Specimen Description   Final    URINE, CATHETERIZED Performed at [facility]  [address], [place], [place] [address]    Special Requests   Final    NONE Performed at [facility] [place] [address] [place], [place] [address]    Culture (A)  Final    >=100,000 COLONIES/mL AEROCOCCUS SPECIES Standardized susceptibility testing for this organism is not available. Performed at [facility], [place] [address] [place], [place] [address]    Report Status 02/01/2019 FINAL  Final     Labs: BNP (last 3 results) No results for input(s): BNP in the last 8760 hours. Basic Metabolic Panel: Recent Labs  Lab 01/28/19 1643 01/29/19 0423 01/30/19 0440 01/31/19 0444  NA 144 145 147* 146*  K 4.8 5.1 4.3 3.8  CL 111 117* 122* 122*  CO2 21* 18* 16* 17*  GLUCOSE 163* 143* 105* 183*  BUN 84* 69* 53* 43*  CREATININE 2.26* 1.57* 1.33* 1.12*  CALCIUM 8.7* 8.3* 8.0* 8.0*  MG 2.8* 2.6*  --   --    Liver Function Tests: Recent Labs  Lab 01/29/19 0423 01/31/19 0444  AST 80* 31  ALT 120* 69*  ALKPHOS 223* 143*   BILITOT 0.4 0.3  PROT 6.5 5.9*  ALBUMIN 2.8* 2.2*   No results for input(s): LIPASE, AMYLASE in the last 168 hours. Recent Labs  Lab 01/31/19 1048  AMMONIA 65*   CBC: Recent Labs  Lab 01/28/19 1643 01/29/19 0423 01/30/19 0440 01/31/19 0444  WBC 6.9 7.6 8.3 10.4  NEUTROABS 3.7  --   --   --   HGB 12.8 11.2* 10.8* 10.9*  HCT 42.6 37.7 35.9* 36.7  MCV 92.2 92.4 94.0 93.1  PLT 437* 380 366 359   Cardiac Enzymes: Recent Labs  Lab 01/28/19 1643  CKTOTAL 153   BNP: Invalid input(s): POCBNP CBG: Recent Labs  Lab 01/31/19 0736 01/31/19 1228 01/31/19 1659 01/31/19 2115 02/01/19 0741  GLUCAP 166* 115* 109* 110* 104*   D-Dimer No results for input(s): DDIMER in the last 72 hours. Hgb A1c No results for input(s): HGBA1C in the last 72 hours. Lipid Profile No results for input(s): CHOL, HDL, LDLCALC, TRIG, CHOLHDL, LDLDIRECT in the last 72 hours. Thyroid function studies No results for input(s): TSH, T4TOTAL, T3FREE, THYROIDAB in the last 72 hours.  Invalid input(s): FREET3 Anemia work up No results for input(s): VITAMINB12, FOLATE, FERRITIN, TIBC, IRON, RETICCTPCT in the last 72 hours. Urinalysis    Component Value Date/Time   COLORURINE AMBER (A) 01/30/2019 1809   APPEARANCEUR CLOUDY (A) 01/30/2019 1809   LABSPEC 1.010 01/30/2019 1809   PHURINE 9.0 (H) 01/30/2019 1809   GLUCOSEU NEGATIVE 01/30/2019 1809   HGBUR MODERATE (A) 01/30/2019 1809   BILIRUBINUR NEGATIVE 01/30/2019 1809   KETONESUR 5 (A) 01/30/2019 1809   PROTEINUR >=300 (A) 01/30/2019 1809   UROBILINOGEN 0.2 07/12/2013 1353   NITRITE NEGATIVE 01/30/2019 1809   LEUKOCYTESUR LARGE (A) 01/30/2019 1809   Sepsis Labs Invalid input(s): PROCALCITONIN,  WBC,  LACTICIDVEN Microbiology Recent Results (from the past 240 hour(s))  Urine culture     Status: Abnormal   Collection Time: 01/30/19  6:05 PM  Result Value Ref Range Status   Specimen Description   Final    URINE, CATHETERIZED Performed at  [facility], [place] [address], [place], [place] [address]    Special Requests   Final    NONE Performed at [facility], [place] [address], [place],  [address]    Culture (A)  Final    >=100,000 COLONIES/mL AEROCOCCUS SPECIES Standardized susceptibility  testing for this organism is not available. Performed at [facility], [place] [address] [place], [place] [address]    Report Status 02/01/2019 FINAL  Final    Please note: You were cared for by a hospitalist during your hospital stay. Once you are discharged, your primary care physician will handle any further medical issues. Please note that NO REFILLS for any discharge medications will be authorized once you are discharged, as it is imperative that you return to your primary care physician (or establish a relationship with a primary care physician if you do not have one) for your post hospital discharge needs so that they can reassess your need for medications and monitor your lab values.    Time coordinating discharge: 40 minutes  SIGNED:   Shelly Coss, MD  [facility] 02/01/2019, 12:13 PM Pager [phone]  If 7PM-7AM, please contact night-coverage [email or web address] Password [email or web address]

## 2019-02-01 NOTE — Care Management Important Message (Signed)
Important Message  Patient Details IM Letter give to the Case Manager to present to the Patient Name: Lauren Rose MRN: [id number] Date of Birth: [birth year]   Medicare Important Message Given:  Yes    [name] 02/01/2019, 10:28 AM

## 2019-02-01 NOTE — TOC Transition Note (Signed)
Transition of Care [facility]) - CM/SW Discharge Note   Patient Details  Name: KYMARI LOLLIS MRN: [id number] Date of Birth: [birth year]  Transition of Care [facility]) CM/SW Contact:  Wende Neighbors, LCSW Phone Number: 02/01/2019, 12:04 PM   Clinical Narrative:   Patient to go to [facility] via [place]. RN to call [phone] for report    Final next level of care: Hospice Medical Facility Barriers to Discharge: No Barriers Identified   Patient Goals and CMS Choice   CMS Medicare.gov Compare Post Acute Care list provided to:: (from facility) Choice offered to / list presented to : (from facility)  Discharge Placement              Patient chooses bed at: ([facility]) Patient to be transferred to facility by: ptar Name of family member notified: daughter Patient and family notified of of transfer: 02/01/19  Discharge Plan and Services In-house Referral: Clinical Social Work                                   Social Determinants of Health ([place]) Interventions     Readmission Risk Interventions Readmission Risk Prevention Plan 01/08/2019  Post Dischage Appt Complete  Medication Screening Complete  Transportation Screening Complete  Some recent data might be hidden

## 2019-02-01 NOTE — TOC Transition Note (Signed)
Transition of Care [facility]) - CM/SW Discharge Note   Patient Details  Name: Lauren Rose MRN: [id number] Date of Birth: November 14, 1921  Transition of Care [facility]) CM/SW Contact:  Wende Neighbors, LCSW Phone Number: 02/01/2019, 10:06 AM   Clinical Narrative:    CSW following patient for Hospice placement. CSW spoke with patients daughter via phone and she stated she would like patient to go to [facility]. CSW spoke with Cheri from [facility] and she stated she will reach out to patients daughter. Cheri stated she will let CSW know if they will be able to take patient today   Final next level of care: Skilled Nursing Facility Barriers to Discharge: Continued Medical Work up   Patient Goals and CMS Choice   CMS Medicare.gov Compare Post Acute Care list provided to:: (from facility) Choice offered to / list presented to : (from facility)  Discharge Placement                       Discharge Plan and Services In-house Referral: Clinical Social Work                                   Social Determinants of Health (SDOH) Interventions     Readmission Risk Interventions Readmission Risk Prevention Plan 01/08/2019  Post Dischage Appt Complete  Medication Screening Complete  Transportation Screening Complete  Some recent data might be hidden

## 2019-02-01 NOTE — Progress Notes (Signed)
Upon assessment of pt, bed noted to be soiled and pt alert only to pain. While changing pt's linens, bed noted to be saturated with BM and blood clots. Hematuria present in foley. On call NP notified. Vital signs stable. Pt cleaned and began to open eyes to staff. Awaiting further orders.

## 2019-02-01 NOTE — Progress Notes (Signed)
OT Cancellation Note  Patient Details Name: ADDYSYN FERN MRN: [id number] DOB: September 13, 1922   Cancelled Treatment:    Reason Eval/Treat Not Completed: Other (comment).  Pt remains lethargic; plan is for residential hospice. Will sign off.  Patricia Fargo 02/01/2019, 9:09 AM  Lesle Chris, OTR/L Acute Rehabilitation Services [phone] [facility] pager [phone] office 02/01/2019

## 2019-02-09 DEATH — deceased

## 2019-02-22 ENCOUNTER — Inpatient Hospital Stay: Payer: Self-pay | Admitting: Adult Health

## 2020-04-04 IMAGING — MR MRI HEAD WITHOUT CONTRAST
12 of 13 series · 43 of 48 positions shown · non-contrast
Comparison: Prior CT from earlier the same day.
COMPARISON: Prior CT from earlier the same day.

Addendum:
CLINICAL DATA: Initial evaluation for acute TIA, dysarthria.

EXAM:
MRI HEAD WITHOUT CONTRAST
TECHNIQUE: Multiplanar, multiecho pulse sequences of the brain and surrounding
structures were obtained without intravenous contrast.

[Series 5: DWI · axial · 4.0mm · 0.88mm/px · z∈[-23,+112]mm · 5 of 72 slices shown (1 of 4)]
[im 1/72]
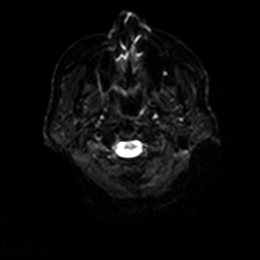
[im 18/72]
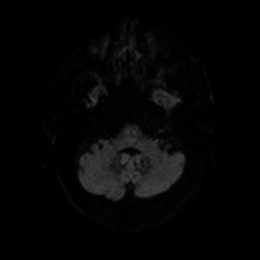
[im 36/72]
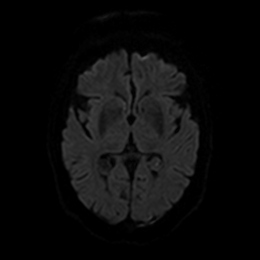
[im 54/72]
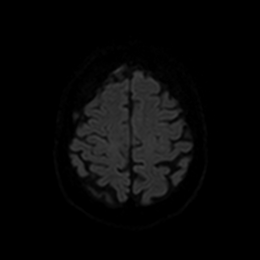
[im 72/72]
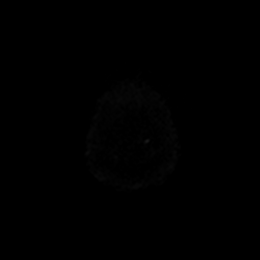

[Series 6: DWI · axial · 4.0mm · 0.88mm/px · z∈[-23,+112]mm · 3 of 36 slices shown (2 of 4)]
[im 1/36]
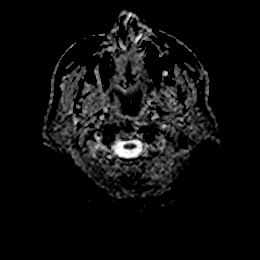
[im 18/36]
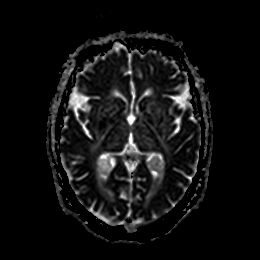
[im 36/36]
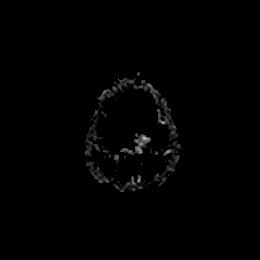

[Series 7: DWI · coronal · 4.0mm · 0.88mm/px · 5 of 64 slices shown (3 of 4)]
[im 1/64]
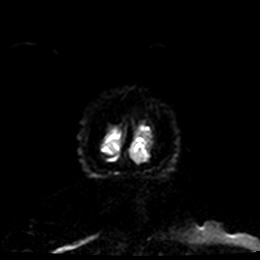
[im 16/64]
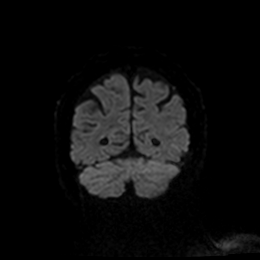
[im 32/64]
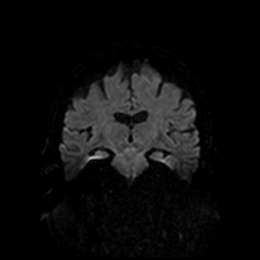
[im 48/64]
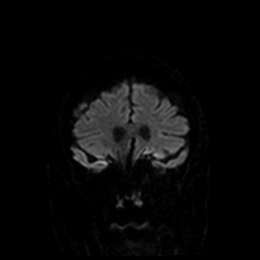
[im 64/64]
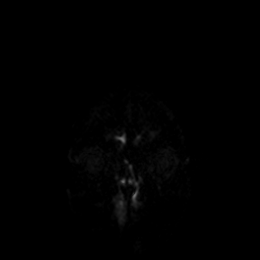

[Series 8: DWI · coronal · 4.0mm · 0.88mm/px · 3 of 32 slices shown (4 of 4)]
[im 1/32]
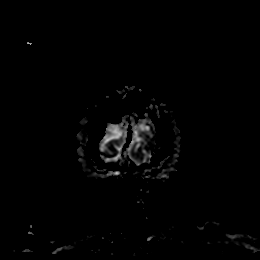
[im 16/32]
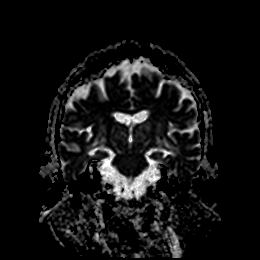
[im 32/32]
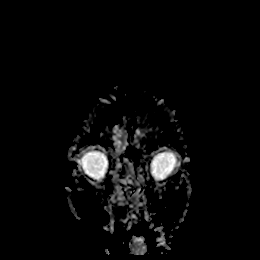

[Series 9: T1 · sagittal · 5.0mm · 0.75mm/px · 2 of 23 slices shown]
[im 1/23]
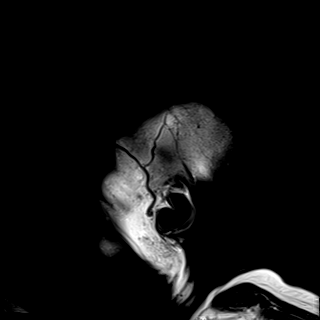
[im 23/23]
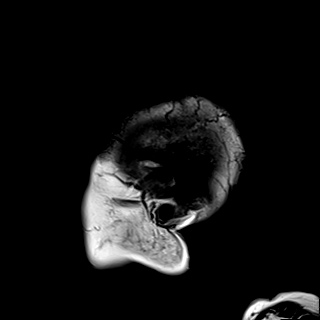

[Series 10: T2 · axial · 5.0mm · 0.72mm/px · z∈[-28,+110]mm · 2 of 25 slices shown (1 of 2)]
[im 1/25]
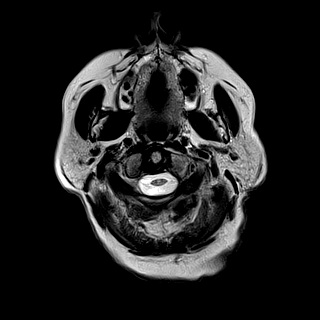
[im 25/25]
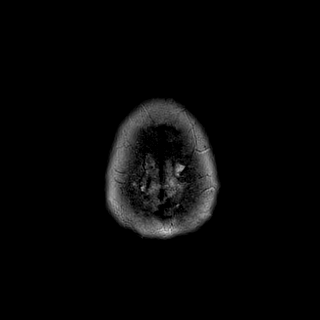

[Series 11: FLAIR · axial · 5.0mm · 0.45mm/px · z∈[-31,+108]mm · 2 of 25 slices shown]
[im 1/25]
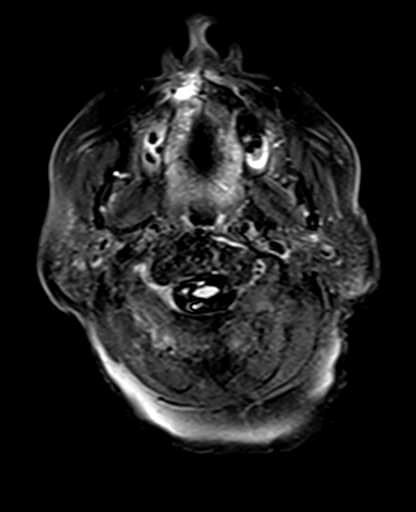
[im 25/25]
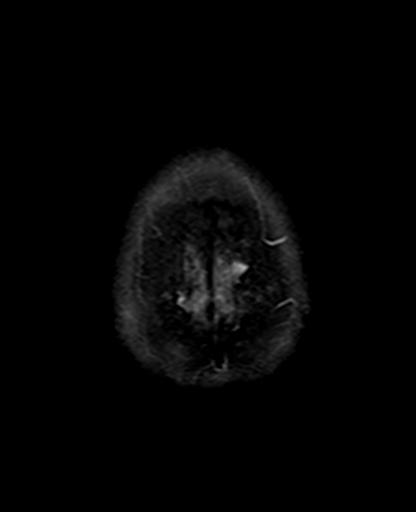

[Series 12: mag_images · axial · 3.0mm · 0.90mm/px · z∈[-38,+132]mm · 5 of 60 slices shown]
[im 1/60]
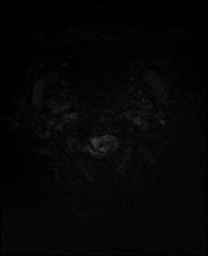
[im 15/60]
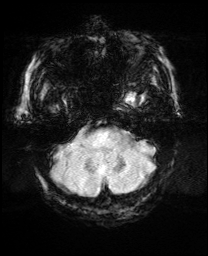
[im 30/60]
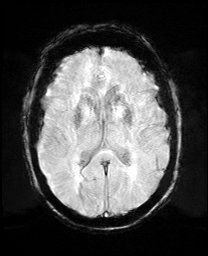
[im 45/60]
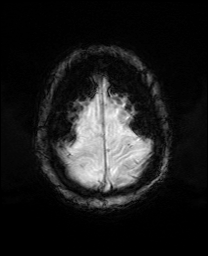
[im 60/60]
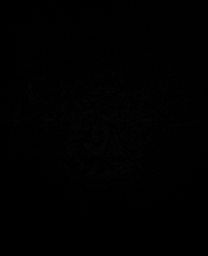

[Series 13: pha_images · axial · 3.0mm · 0.90mm/px · z∈[-38,+124]mm · 5 of 57 slices shown]
[im 1/57]
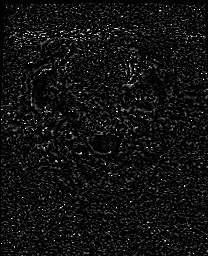
[im 15/57]
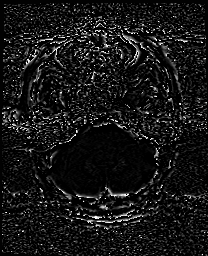
[im 29/57]
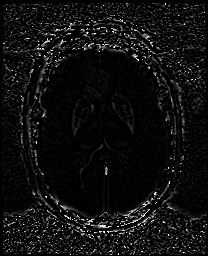
[im 43/57]
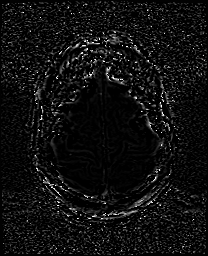
[im 57/57]
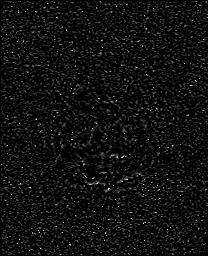

[Series 14: swi_images · axial · 3.0mm · 0.90mm/px · z∈[-38,+132]mm · 5 of 60 slices shown]
[im 1/60]
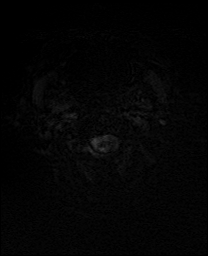
[im 15/60]
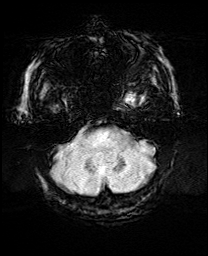
[im 30/60]
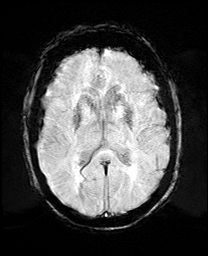
[im 45/60]
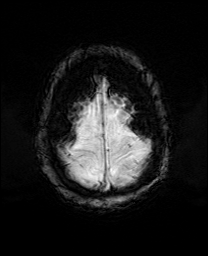
[im 60/60]
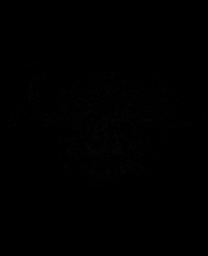

[Series 15: mip_images(sw) · axial · 24.0mm · 0.90mm/px · z∈[-28,+122]mm · 4 of 53 slices shown]
[im 1/53]
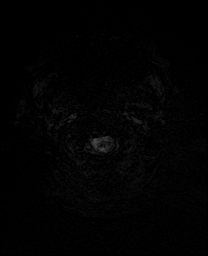
[im 18/53]
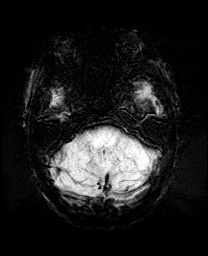
[im 35/53]
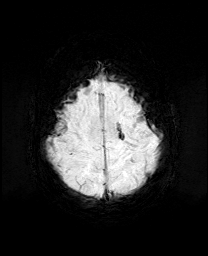
[im 53/53]
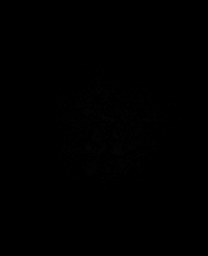

[Series 18: T2 · coronal · 5.0mm · 0.72mm/px · 2 of 28 slices shown (2 of 2)]
[im 1/28]
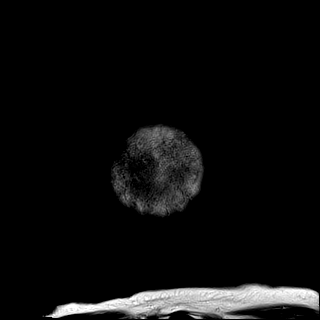
[im 28/28]
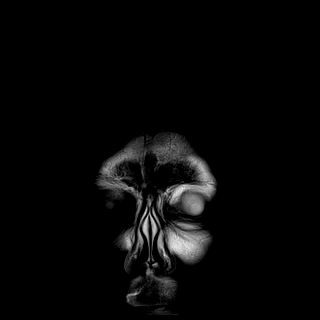

[43 of 48 positions shown; findings below may reference images not displayed]

FINDINGS: Brain: Diffuse prominence of the CSF containing spaces compatible
with generalized age-related cerebral atrophy. Changes are most
prominent at the anterior temporal lobes bilaterally. Patchy and
confluent T2/FLAIR hyperintensity within the periventricular and
deep white matter both cerebral hemispheres most consistent with
chronic microvascular ischemic disease. Chronic microvascular
ischemic changes present within the pons as well.

6 mm focus of diffusion abnormality seen within the posterior left
corona radiata, consistent with a small acute a infarct (series 5,
image 59). Associated susceptibility artifact, consistent with
hemorrhage. Small focus of hemorrhage seen within this region on
prior CT as well. Layering susceptibility artifact within the
occipital horns of the lateral ventricles, left greater than right,
suggestive of intraventricular extension, age indeterminate, but
could be acute to subacute in nature. No hydrocephalus or
ventricular trapping.

Additional 5 mm focus of diffusion abnormality involving the
subcortical white matter of the high anterior left frontal lobe
consistent with an acute to subacute ischemic infarct (series 5,
image 67). Additionally, there is a 6 mm focus of linear diffusion
abnormality involving the central aspect of the medulla (series 5,
image 43). Associated signal loss on ADC map (series 6, image 6).
Finding consistent with a small acute to early subacute ischemic
infarct.

No other evidence for acute or subacute ischemia. Gray-white matter
differentiation otherwise maintained. No other areas of remote
cortical infarction.

No mass lesion, midline shift or mass effect. Trace extra-axial
collection measuring 2-3 mm in thickness seen overlying the left
occipital convexity, likely a small subdural hematoma, age
indeterminate, but could be subacute in nature. This is not well
visualized on prior CT.

Vascular: Major intracranial vascular flow voids are maintained.

Skull and upper cervical spine: Craniocervical junction within
normal limits. Upper cervical spine normal. Bone marrow signal
intensity within normal limits. Hyperostosis frontalis interna
noted. No scalp soft tissue abnormality.

Sinuses/Orbits: Globes and orbital soft tissues demonstrate no acute
finding. Patient status post bilateral ocular lens replacement.
Paranasal sinuses are clear. No mastoid effusion. Inner ear
structures grossly normal.

Other: None.
IMPRESSION: 1. 6 mm acute hemorrhagic infarct involving the periventricular
white matter of the posterior left corona radiata without
significant mass effect. Associated trace layering intraventricular
hemorrhage somewhat age indeterminate, but could be related to
intraventricular extension. No hydrocephalus or ventricular
trapping.
2. Additional 6 mm linear acute to early subacute ischemic
nonhemorrhagic infarct involving the central medulla.
3. Additional 6 mm acute to early subacute ischemic nonhemorrhagic
infarct involving the subcortical white matter of the anterior left
frontal lobe.
4. Trace 2-3 mm subdural collection overlying the occipital
convexity, likely blood products. Finding is age indeterminate, but
could be acute to subacute in nature. No associated mass effect.
5. Underlying age-related cerebral atrophy with moderate chronic
microvascular ischemic disease.

A call is in to the ordering clinician. Findings will be
communicated as soon as possible.

ADDENDUM:
Findings were communicated by telephone to the covering nurse
practitioner Venzor at [DATE] a.m. on 01/05/2019.

*** End of Addendum ***
FINDINGS: Brain: Diffuse prominence of the CSF containing spaces compatible
with generalized age-related cerebral atrophy. Changes are most
prominent at the anterior temporal lobes bilaterally. Patchy and
confluent T2/FLAIR hyperintensity within the periventricular and
deep white matter both cerebral hemispheres most consistent with
chronic microvascular ischemic disease. Chronic microvascular
ischemic changes present within the pons as well.

6 mm focus of diffusion abnormality seen within the posterior left
corona radiata, consistent with a small acute a infarct (series 5,
image 59). Associated susceptibility artifact, consistent with
hemorrhage. Small focus of hemorrhage seen within this region on
prior CT as well. Layering susceptibility artifact within the
occipital horns of the lateral ventricles, left greater than right,
suggestive of intraventricular extension, age indeterminate, but
could be acute to subacute in nature. No hydrocephalus or
ventricular trapping.

Additional 5 mm focus of diffusion abnormality involving the
subcortical white matter of the high anterior left frontal lobe
consistent with an acute to subacute ischemic infarct (series 5,
image 67). Additionally, there is a 6 mm focus of linear diffusion
abnormality involving the central aspect of the medulla (series 5,
image 43). Associated signal loss on ADC map (series 6, image 6).
Finding consistent with a small acute to early subacute ischemic
infarct.

No other evidence for acute or subacute ischemia. Gray-white matter
differentiation otherwise maintained. No other areas of remote
cortical infarction.

No mass lesion, midline shift or mass effect. Trace extra-axial
collection measuring 2-3 mm in thickness seen overlying the left
occipital convexity, likely a small subdural hematoma, age
indeterminate, but could be subacute in nature. This is not well
visualized on prior CT.

Vascular: Major intracranial vascular flow voids are maintained.

Skull and upper cervical spine: Craniocervical junction within
normal limits. Upper cervical spine normal. Bone marrow signal
intensity within normal limits. Hyperostosis frontalis interna
noted. No scalp soft tissue abnormality.

Sinuses/Orbits: Globes and orbital soft tissues demonstrate no acute
finding. Patient status post bilateral ocular lens replacement.
Paranasal sinuses are clear. No mastoid effusion. Inner ear
structures grossly normal.

Other: None.
IMPRESSION: 1. 6 mm acute hemorrhagic infarct involving the periventricular
white matter of the posterior left corona radiata without
significant mass effect. Associated trace layering intraventricular
hemorrhage somewhat age indeterminate, but could be related to
intraventricular extension. No hydrocephalus or ventricular
trapping.
2. Additional 6 mm linear acute to early subacute ischemic
nonhemorrhagic infarct involving the central medulla.
3. Additional 6 mm acute to early subacute ischemic nonhemorrhagic
infarct involving the subcortical white matter of the anterior left
frontal lobe.
4. Trace 2-3 mm subdural collection overlying the occipital
convexity, likely blood products. Finding is age indeterminate, but
could be acute to subacute in nature. No associated mass effect.
5. Underlying age-related cerebral atrophy with moderate chronic
microvascular ischemic disease.

A call is in to the ordering clinician. Findings will be
communicated as soon as possible.
